# Patient Record
Sex: Male | Born: 1985
Health system: Southern US, Community
[De-identification: ages and names within clinical notes are randomized; demographics above are authoritative.]

## PROBLEM LIST (undated history)

## (undated) DIAGNOSIS — T783XXA Angioneurotic edema, initial encounter: Secondary | ICD-10-CM

## (undated) DIAGNOSIS — K219 Gastro-esophageal reflux disease without esophagitis: Secondary | ICD-10-CM

## (undated) DIAGNOSIS — D131 Benign neoplasm of stomach: Secondary | ICD-10-CM

## (undated) DIAGNOSIS — K2210 Ulcer of esophagus without bleeding: Secondary | ICD-10-CM

## (undated) HISTORY — DX: Gastro-esophageal reflux disease without esophagitis: K21.9

## (undated) HISTORY — DX: Ulcer of esophagus without bleeding: K22.10

## (undated) HISTORY — DX: Benign neoplasm of stomach: D13.1

## (undated) HISTORY — PX: TONSILLECTOMY: SUR1361

## (undated) HISTORY — PX: ESOPHAGOGASTRODUODENOSCOPY: SHX1529

## (undated) HISTORY — DX: Angioneurotic edema, initial encounter: T78.3XXA

---

## 2009-05-25 ENCOUNTER — Encounter: Payer: Self-pay | Admitting: Internal Medicine

## 2009-05-27 ENCOUNTER — Encounter (INDEPENDENT_AMBULATORY_CARE_PROVIDER_SITE_OTHER): Payer: Self-pay | Admitting: *Deleted

## 2009-05-27 ENCOUNTER — Encounter: Admission: RE | Admit: 2009-05-27 | Discharge: 2009-05-27 | Payer: Self-pay | Admitting: Internal Medicine

## 2009-05-27 ENCOUNTER — Encounter: Payer: Self-pay | Admitting: Internal Medicine

## 2009-06-14 ENCOUNTER — Encounter (INDEPENDENT_AMBULATORY_CARE_PROVIDER_SITE_OTHER): Payer: Self-pay | Admitting: *Deleted

## 2009-07-27 ENCOUNTER — Encounter (INDEPENDENT_AMBULATORY_CARE_PROVIDER_SITE_OTHER): Payer: Self-pay | Admitting: *Deleted

## 2009-07-27 ENCOUNTER — Ambulatory Visit: Payer: Self-pay | Admitting: Internal Medicine

## 2009-07-27 DIAGNOSIS — R142 Eructation: Secondary | ICD-10-CM

## 2009-07-27 DIAGNOSIS — R935 Abnormal findings on diagnostic imaging of other abdominal regions, including retroperitoneum: Secondary | ICD-10-CM

## 2009-07-27 DIAGNOSIS — R141 Gas pain: Secondary | ICD-10-CM

## 2009-07-27 DIAGNOSIS — K219 Gastro-esophageal reflux disease without esophagitis: Secondary | ICD-10-CM

## 2009-07-27 DIAGNOSIS — R1319 Other dysphagia: Secondary | ICD-10-CM

## 2009-07-27 DIAGNOSIS — R143 Flatulence: Secondary | ICD-10-CM

## 2009-07-28 ENCOUNTER — Ambulatory Visit: Payer: Self-pay | Admitting: Internal Medicine

## 2009-07-28 DIAGNOSIS — K2210 Ulcer of esophagus without bleeding: Secondary | ICD-10-CM

## 2009-07-28 HISTORY — DX: Ulcer of esophagus without bleeding: K22.10

## 2009-08-04 ENCOUNTER — Telehealth: Payer: Self-pay | Admitting: Internal Medicine

## 2009-08-17 ENCOUNTER — Ambulatory Visit (HOSPITAL_COMMUNITY): Admission: RE | Admit: 2009-08-17 | Discharge: 2009-08-17 | Payer: Self-pay | Admitting: Internal Medicine

## 2009-08-26 ENCOUNTER — Ambulatory Visit: Payer: Self-pay | Admitting: Internal Medicine

## 2009-08-29 ENCOUNTER — Telehealth (INDEPENDENT_AMBULATORY_CARE_PROVIDER_SITE_OTHER): Payer: Self-pay

## 2009-12-16 ENCOUNTER — Encounter: Payer: Self-pay | Admitting: Internal Medicine

## 2010-08-17 NOTE — Progress Notes (Signed)
Summary: [facility] Referral/Dr Alycia Rossetti  ---- Converted from flag ---- ---- 08/28/2009 11:24 AM, Iva Boop MD, Clementeen Graham wrote: Orlan Leavens appt with Dr. Alycia Rossetti re: Bloating problems and ? of abnormal upper GI function/motility - send all our records needs discs of all xrays to go also, in [facility] format ------------------------------  Phone Note Outgoing Call Call back at [facility] Phone [phone]   Call placed by: Darcey Nora RN, CGRN,  August 29, 2009 11:39 AM Call placed to: Patient Summary of Call: Patient  is scheduled to see Dr Alycia Rossetti at [facility] 12-14-09 2 pm.  I have left a messge for the patient to call back to discuss appointment details Initial call taken by: Darcey Nora RN, CGRN,  August 29, 2009 11:40 AM  Follow-up for Phone Call        I have left a message fo the patient with the appointment details and that he will need to get a disk of his GES to take with him Follow-up by: Darcey Nora RN, CGRN,  August 30, 2009 11:09 AM     Appended Document: [facility] Referral/Dr Alycia Rossetti the patient did not show for appointment please contact him about this  Appended Document: [facility] Referral/Dr [facility] Left message for patient to call back    Appended Document: [facility] Referral/Dr Alycia Rossetti I spoke with the patient's mother.  Patient  was in [place] and he missed his flight and didn't return till 12/15/09.  He is rescheduled to see Dr Alycia Rossetti 03/22/10 2:00

## 2010-08-17 NOTE — Assessment & Plan Note (Signed)
Summary: GERD   History of Present Illness Visit Type: Initial Consult Primary GI MD: Stan Head MD [facility] Primary Provider: Creola Corn, MD Chief Complaint: Pt. c/o heartburn, bloating and sometimes difficulty swallowing worse while lying down at night History of Present Illness:   25 year old white man I am asked to see about acid reflux problems. He has had heartburn, indigestion, and acid reflux for years, since childhood it sounds like. His father is here and participate in history. He saw Dr. Timothy Lasso about this recently, and was prescribed Protonix which has alleviated his symptoms significantly but not completely. He also describes bloating, and an inability to belch. He will often hear "oises"n in his stomach and chest, particularly after eating or drinking carbonated beverages. If he lies on his left side this is more of a problem or more noticeable. The bloating and gas pressure couple with the inability to burp to cause problems. He has some increased flatus but not significantly. I should say it's not that he cannot belch but it is rare he says. He also describes some dysphagia, with a suprasternal sticking point with solids and liquids and perhaps some mild globus issues. In the past few months he's had some alternating bowel habits, but that was not or ever chronic, and has resolved. The remainder of his GI review of systems is negative.  He did have an upper GI series, I have reviewed the report and the images, it shows multiple  reflux episodes, with a small sliding hiatal hernia and normal motility and what is described as a normal appearance of the stomach and duodenum. The reflux occurred up to the mid and upper esophageal level.   GI Review of Systems    Reports acid reflux, bloating, dysphagia with liquids, dysphagia with solids, and  heartburn.      Denies abdominal pain, belching, chest pain, loss of appetite, nausea, vomiting, vomiting blood, weight loss, and  weight gain.     Reports constipation and  diarrhea.     Denies anal fissure, black tarry stools, change in bowel habit, diverticulosis, fecal incontinence, heme positive stool, hemorrhoids, irritable bowel syndrome, jaundice, light color stool, liver problems, rectal bleeding, and  rectal pain.    Current Medications (verified): 1)  Protonix 40 Mg Tbec (Pantoprazole Sodium) .Marland Kitchen.. 1 Tablet By Mouth Once Daily  Allergies (verified): 1)  ! Amoxicillin 2)  ! Pcn  Past History:  Past Medical History: Unremarkable  Past Surgical History: Tonsillectomy  Family History: No FH of Colon Cancer: Lung Cancer-MGF Family History of Heart Disease: PGF  Review of Systems       The patient complains of allergy/sinus.         All other ROS negative except as per HPI.   Vital Signs:  Patient profile:   25 year old male Height:      73 inches Weight:      193.38 pounds BMI:     25.61 Pulse rate:   104 / minute Pulse rhythm:   regular BP sitting:   144 / 78  (left arm)  Vitals Entered By: Milford Cage NCMA (July 27, 2009 2:45 PM)  Physical Exam  General:  Well developed, well nourished, no acute distress. Eyes:  PERRLA, no icterus. Mouth:  No deformity or lesions, dentition normal. Neck:  Supple; no masses or thyromegaly. Lungs:  Clear throughout to auscultation. Heart:  Regular rate and rhythm; no murmurs, rubs,  or bruits. Abdomen:  tympanitic over upper abdomen, mildly distended there, no masses,  HSM, herniae. BS+,  Extremities:  No clubbing, cyanosis, edema or deformities noted. Neurologic:  Alert and  oriented x4;   Cervical Nodes:  No significant cervical or supraclavicular adenopathy.  Psych:  Alert and cooperative. Normal mood and affect.   Impression & Recommendations:  Problem # 1:  GASTROESOPHAGEAL REFLUX DISEASE (ICD-530.81) Proven by hx and UGI series. ? if related topossible gastric or intestinal dysfunction given impression of distended stomach or bowel  on exam today. I  think there is Orders: EGD (EGD) this will also serve to screen for Barrett's esophagus  Problem # 2:  DYSPHAGIA (ICD-787.29) No stricture on UGI, ? undetected stricture or motility issue, could be related to GERD/inflammation, ? eosinophilic esophagitis. Presence of what sounds like globus is noted. Risks, benefits,and indications of endoscopic procedure(s) were reviewed with the patient and all questions answered.   Orders: EGD (EGD) - possible dilation  Problem # 3:  ABDOMINAL BLOATING (ICD-787.3) He has "trapped air" and can't belch much at all. Coupled with abdominal exam and UGI scout xrays suggests some abnormality in gut (dilated).  Will possibly need diet change. Check AXR, EGD also fisrt.  Orders: T-Abdomen 2-view (74020TC)  Problem # 4:  GASTROINTESTINAL XRAY, ABNORMAL (ICD-793.4) dilated left colon (I think) UGI scout, ? if stomach ? anatomic issues  Orders: T-Abdomen 2-view (27062BJ)  may need other imaging also await EGD  Patient Instructions: 1)  Please go to the basement to have your xray done  today.  2)  We will see you at your procedure on 07/28/09. 3)  [facility] Patient Information Guide given to patient.  4)  Upper Endoscopy with Dilatation brochure given.  5)  The medication list was reviewed and reconciled.  All changed / newly prescribed medications were explained.  A complete medication list was provided to the patient / caregiver. cc: Dr. Creola Corn

## 2010-08-17 NOTE — Progress Notes (Signed)
Summary: Triage  Phone Note Call from Patient Call back at [phone]   Caller: father Ray Call For: Dr. Leone Payor Reason for Call: Talk to Nurse Summary of Call: Pt said he is returning you call about some test results Initial call taken by: Karna Christmas,  August 04, 2009 1:20 PM  Follow-up for Phone Call        Patient  is currently in [place] , his father will have him call me back when he returns next week. Follow-up by: Darcey Nora RN, CGRN,  August 04, 2009 1:57 PM

## 2010-08-17 NOTE — Assessment & Plan Note (Signed)
Summary: follow up ges/sherBloating, GERD, dysphagia   History of Present Illness Visit Type: Follow-up Visit Primary GI MD: Stan Head MD [facility] Primary Provider: Creola Corn, MD Requesting Provider: n/a Chief Complaint: F/u from GES  History of Present Illness:   25 year old single white man seen on July 27, 2009 regarding reflux and problems with inability to belch. He is currently on protonic 40 mg daily with no heartburn or reflux symptoms. He continues, however, to have an inability to belch and problems with gas and bloating. He had what was perceived to be dilated stomach  on x-ray at one point.  An upper GI series had been previously performed, showing a small sliding hiatus hernia but otherwise no significant abnormalities reported. It was my perception that the colon was somewhat dilated on the scout film. EGD 1/13 demonstrated mild erosive esophagitis, chronic actine gastrtis without H. pylori and small hiatus hernia. GAstric emtying study showed complete emptying of the stomach at 2 hours (<1% remaining).  He still has problems with his bloating and inability to belch after eating.Alka seltzer, CO2 no help and makes things worse. He again reports that ingesting beer is particularly troublesome.            Current Medications (verified): 1)  Protonix 40 Mg Tbec (Pantoprazole Sodium) .Marland Kitchen.. 1 Tablet By Mouth Once Daily  Allergies (verified): 1)  ! Amoxicillin 2)  ! Pcn  Past History:  Past Medical History: Reviewed history from 07/27/2009 and no changes required. Unremarkable  Past Surgical History: Reviewed history from 07/27/2009 and no changes required. Tonsillectomy  Family History: Reviewed history from 07/27/2009 and no changes required. No FH of Colon Cancer: Lung Cancer-MGF Family History of Heart Disease: PGF  Social History: Occupation:[employer]  Patient is a former smoker.  Alcohol Use - yes: Occ  Daily Caffeine Use: 4 times daily    Illicit Drug Use - no Smoking Status:  quit Drug Use:  no  Vital Signs:  Patient profile:   25 year old male Height:      73 inches Weight:      194 pounds BMI:     25.69 BSA:     2.13 Pulse rate:   98 / minute BP sitting:   124 / 76  (left arm) Cuff size:   regular  Vitals Entered By: Ok Anis CMA (August 26, 2009 2:29 PM)  Physical Exam  General:  Well developed, well nourished, no acute distress.   Impression & Recommendations:  Problem # 1:  ABDOMINAL BLOATING (ICD-787.3) Assessment Unchanged I am not certain as to the cause of this. I am suspicious of some type of gastrointestinal motility disturbance. It is not necessarily abnormal but it is unusual for the stomach to empty completely in 2 hours in my experience. I have explained the overall situation to him. His EGD shows gastritis question of that could be related, his reflux is controlled, there have been some x-rays demonstrated dilation of the stomach and I think perhaps the left colon or splenic flexure area at one point. Though this is not, at this point, a significant medical condition as far as I can tell it does affect his quality of life. Will refer to Dr. Beverly Gust for additinal evaluattion and tretment recommendations.  Problem # 2:  GASTROESOPHAGEAL REFLUX DISEASE (ICD-530.81) Assessment: Improved there was an erosion at the July 28, 2009 EGD. He is responding nicely to proton next 40 mg daily and we will continue that.  Problem # 3:  OTHER  DYSPHAGIA (ICD-787.29) Assessment: Improved resolved on PPI so presumably due to reflux esophagitis.  Patient Instructions: 1)  Please pick up your medications at your pharmacy--Protonix 2)  We will make a referral appointment for you with Dr. Alycia Rossetti (pronounced cook) at [facility].  You should hear from [place] in 1-2 weeks.  If you have not, please call our office. 3)  Copy sent to : Creola Corn, MD 4)  The medication list was reviewed and reconciled.  All changed /  newly prescribed medications were explained.  A complete medication list was provided to the patient / caregiver. Prescriptions: PROTONIX 40 MG TBEC (PANTOPRAZOLE SODIUM) 1 tablet by mouth once daily  #30 x 11   Entered by:   Francee Piccolo CMA (AAMA)   Authorized by:   Iva Boop MD, [facility]   Signed by:   Francee Piccolo CMA (AAMA) on 08/26/2009   Method used:   Electronically to        [facility] [id number] (retail)       [address] [place], [place]  [address]       Ph: [phone]       Fax: [phone]   RxID:   [id number]

## 2010-08-17 NOTE — Procedures (Signed)
Summary: Upper Endoscopy  Patient: Francis [name] Note: All result statuses are Final unless otherwise noted.  Tests: (1) Upper Endoscopy (EGD)   EGD Upper Endoscopy       DONE     [facility]     [address]     [place], [place]  [address]           ENDOSCOPY PROCEDURE REPORT           PATIENT:  [name], Francis  MR#:  [id number]     BIRTHDATE:  [birth year], 23 yrs. old  GENDER:  male           ENDOSCOPIST:  Iva Boop, MD, [facility]     Referred by:  Creola Corn, M.D.           PROCEDURE DATE:  07/28/2009     PROCEDURE:  EGD with biopsy     ASA CLASS:  Class I     INDICATIONS:  GERD, dysphagia vague dysphagia, mainly when he     turns head to side in bed, difficult to swallow saliva, liquid -     has never had dysphagia with meat           MEDICATIONS:   Fentanyl 100 mcg IV, Versed 10 mg     TOPICAL ANESTHETIC:  Exactacain Spray           DESCRIPTION OF PROCEDURE:   After the risks benefits and     alternatives of the procedure were thoroughly explained, informed     consent was obtained.  The LB GIF-H180 [id number] endoscope was     introduced through the mouth and advanced to the second portion of     the duodenum, without limitations.  The instrument was slowly     withdrawn as the mucosa was fully examined.     <<PROCEDUREIMAGES>>           An erosion was found in the distal esophagus. Just above z-line,     small erosion 2-3 mm size Multiple biopsies were obtained and sent     to pathology.  Erythema was found in the antrum. Associated     hypervascularity, ? gastritis. Multiple biopsies were obtained and     sent to pathology.  A hiatal hernia was found. It was 1 - 2 cm in     size and sliding.  Otherwise the examination was normal.     Retroflexed views revealed no abnormalities.    The scope was then     withdrawn from the patient and the procedure completed.     COMPLICATIONS:  None           ENDOSCOPIC IMPRESSION:     1) Erosion in the distal  esophagus - biopsied     2) Erythema and hypervascularity  in the antrum - biopsied ?     gastritis     3) 1 - 2 cm sliding hiatal hernia     4) Otherwise normal examination     RECOMMENDATIONS:     1) await biopsy results     continue Protonix 40 mg daily           his stomach appeared dilated on abdominal films yesterday, given     his problems with GERD and bloating will consider gastric emptying     study pending biopsy results           No stricture seen and further questioning today indicated that the  swallowing problems are only with head turned so I did not dilate     the esophagus.           Note: He tolerated the exam well but was not very sedated.           REPEAT EXAM:  as needed           Iva Boop, MD, Clementeen Graham           CC:  Creola Corn, MD, The Patient           n.     eSIGNED:   Iva Boop at 07/28/2009 04:17 PM           [name], Francis, [id number]  Note: An exclamation mark (!) indicates a result that was not dispersed into the flowsheet. Document Creation Date: 07/28/2009 4:17 PM _______________________________________________________________________  (1) Order result status: Final Collection or observation date-time: 07/28/2009 16:07 Requested date-time:  Receipt date-time:  Reported date-time:  Referring Physician:   Ordering Physician: Stan Head [phone]) Specimen Source:  Source: Launa Grill Order Number: [id number] Lab site:

## 2010-08-17 NOTE — Letter (Signed)
Summary: No show/[facility]  No show/[facility]   Imported By: Lester Menasha 01/03/2010 09:44:43  _____________________________________________________________________  External Attachment:    Type:   Image     Comment:   External Document

## 2010-08-17 NOTE — Letter (Signed)
Summary: Append:Upper GI/[facility]  Append:Upper GI/[facility]   Imported By: Sherian Rein 08/01/2009 09:46:13  _____________________________________________________________________  External Attachment:    Type:   Image     Comment:   External Document

## 2010-08-17 NOTE — Letter (Signed)
Summary: OV/[facility]  OV/[facility]   Imported By: Sherian Rein 08/01/2009 09:43:54  _____________________________________________________________________  External Attachment:    Type:   Image     Comment:   External Document

## 2010-08-17 NOTE — Letter (Signed)
Summary: EGD Instructions  [facility]  [address] [place], [place] [address]   Phone: [phone]  Fax: [phone]       Francis [name]    Nov 23, 1985    MRN: [id number]       Procedure Day /Date: Thursday, 07/28/09     Arrival Time: 2:00pm     Procedure Time: 3:00pm     Location of Procedure:                    _X_ [facility] (4th Floor)  PREPARATION FOR ENDOSCOPY   On Thursday, 07/28/09, THE DAY OF THE PROCEDURE:  1.   No solid foods, milk or milk products are allowed after midnight the night before your procedure.  2.   Do not drink anything colored red or purple.  Avoid juices with pulp.  No orange juice.  3.  You may drink clear liquids until 1:00pm, which is 2 hours before your procedure.                                                                                                CLEAR LIQUIDS INCLUDE: Water Jello Ice Popsicles Tea (sugar ok, no milk/cream) Powdered fruit flavored drinks Coffee (sugar ok, no milk/cream) Gatorade Juice: apple, white grape, white cranberry  Lemonade Clear bullion, consomm, broth Carbonated beverages (any kind) Strained chicken noodle soup Hard Candy   MEDICATION INSTRUCTIONS  Unless otherwise instructed, you should take regular prescription medications with a small sip of water as early as possible the morning of your procedure.                  OTHER INSTRUCTIONS  You will need a responsible adult at least 24 years of age to accompany you and drive you home.   This person must remain in the waiting room during your procedure.  Wear loose fitting clothing that is easily removed.  Leave jewelry and other valuables at home.  However, you may wish to bring a book to read or an iPod/MP3 player to listen to music as you wait for your procedure to start.  Remove all body piercing jewelry and leave at home.  Total time from sign-in until discharge is approximately 2-3 hours.  You  should go home directly after your procedure and rest.  You can resume normal activities the day after your procedure.  The day of your procedure you should not:   Drive   Make legal decisions   Operate machinery   Drink alcohol   Return to work  You will receive specific instructions about eating, activities and medications before you leave.    The above instructions have been reviewed and explained to me by   _______________________    I fully understand and can verbalize these instructions _____________________________ Date _________

## 2010-08-27 IMAGING — CR DG ABDOMEN 2V
3 series · 3 of 3 positions shown · non-contrast
Comparison: KUB prior to upper GI on 05/27/2009

CLINICAL DATA: Chronic bloating

ABDOMEN - 2 VIEW

[view not recorded (1 of 3)]
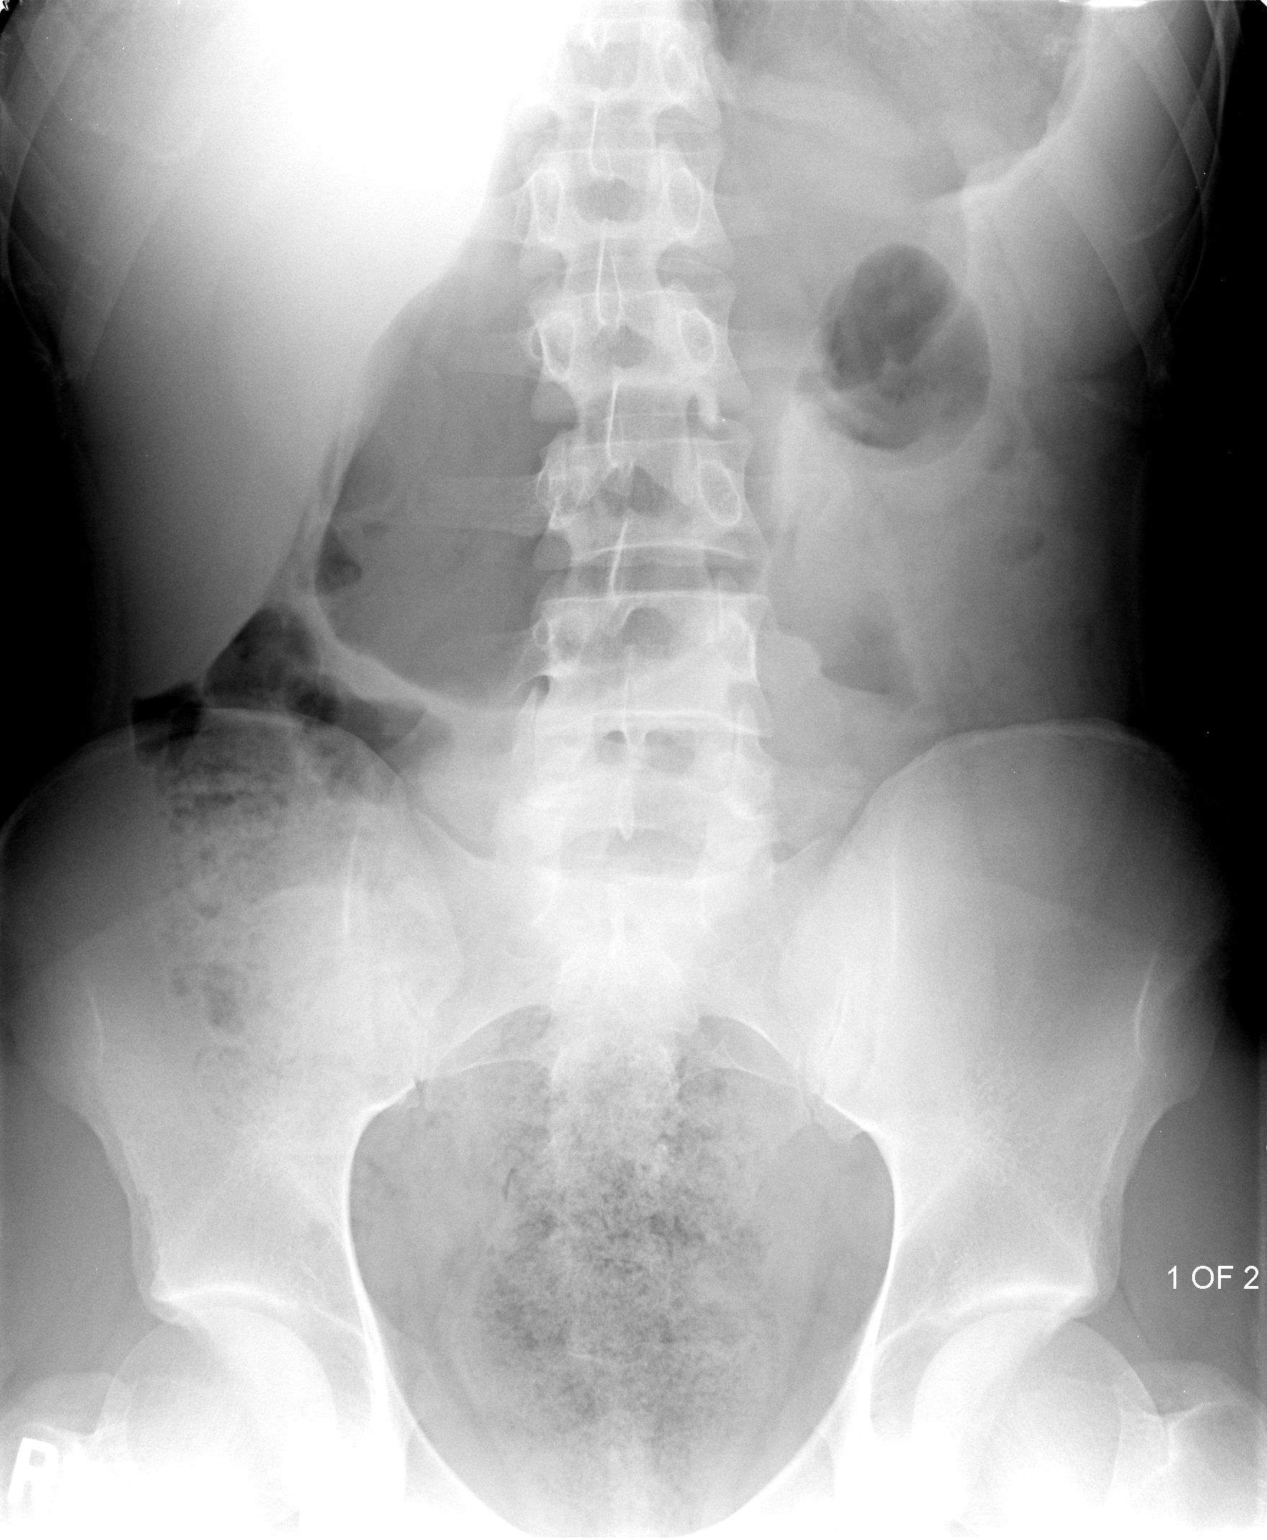

[view not recorded (2 of 3)]
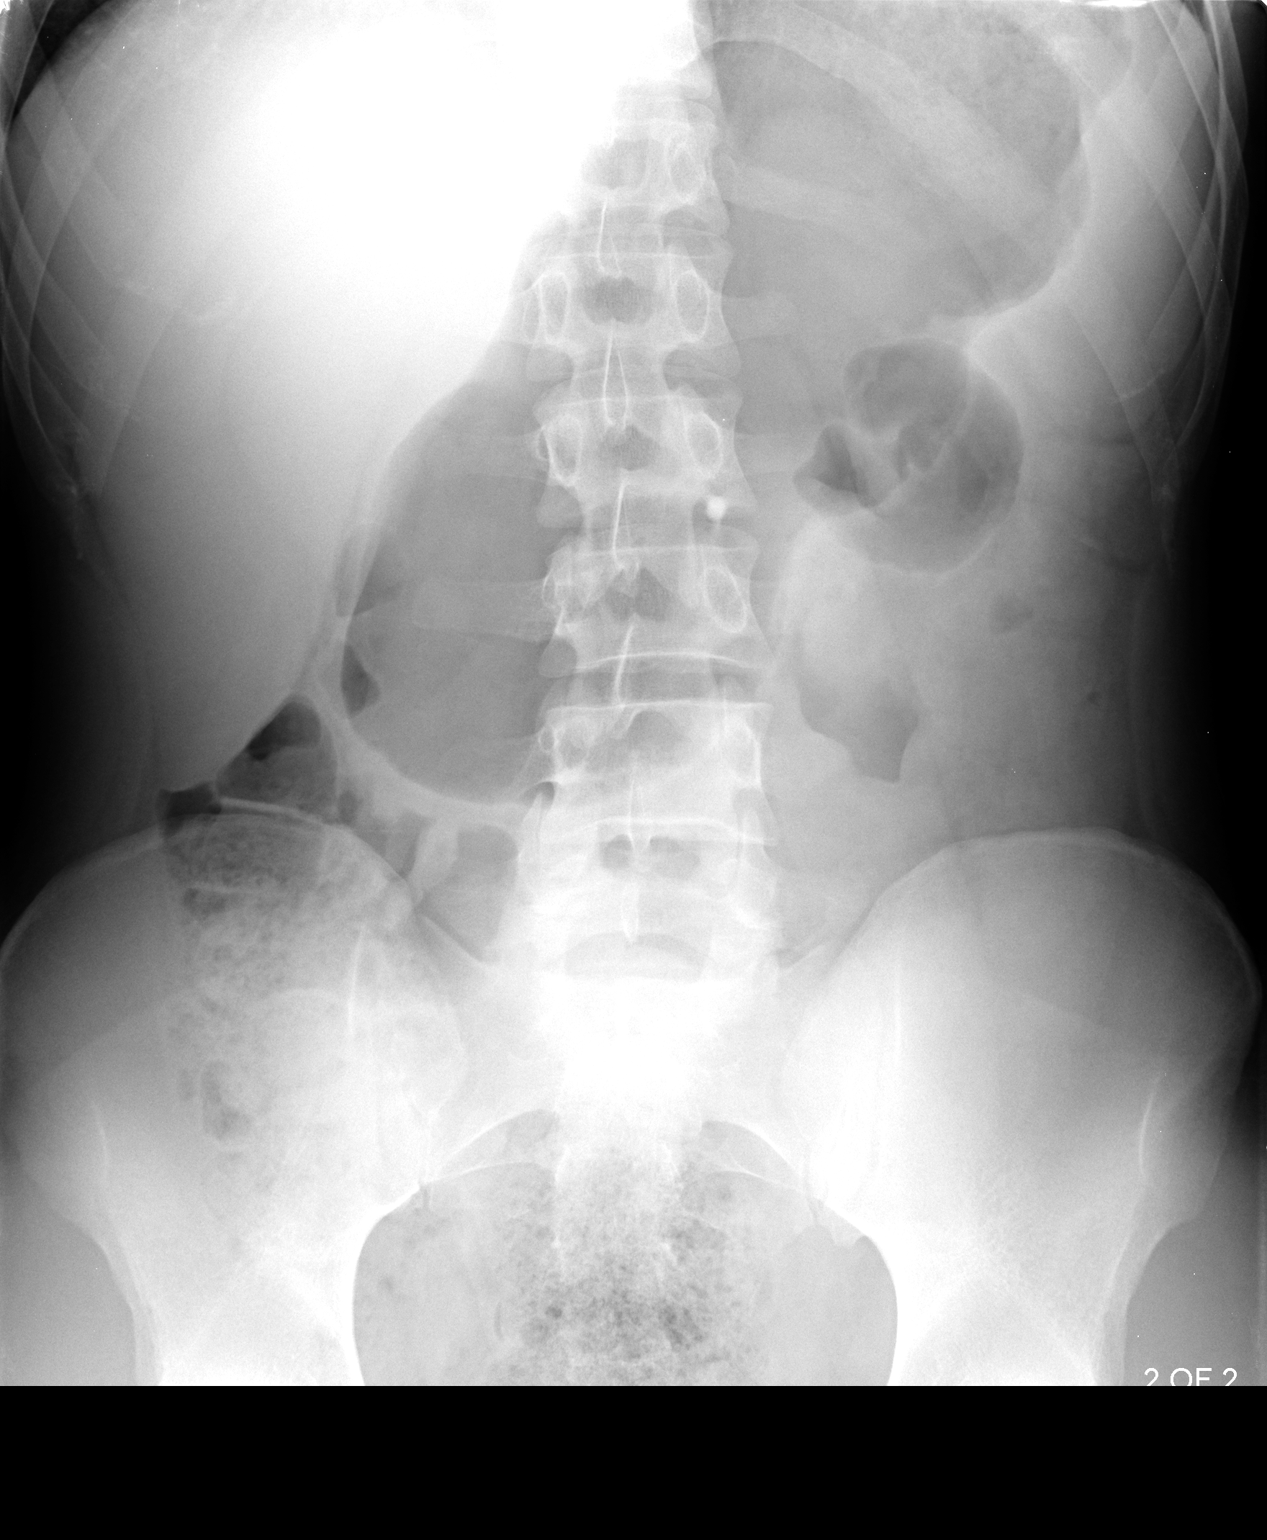

[view not recorded (3 of 3)]
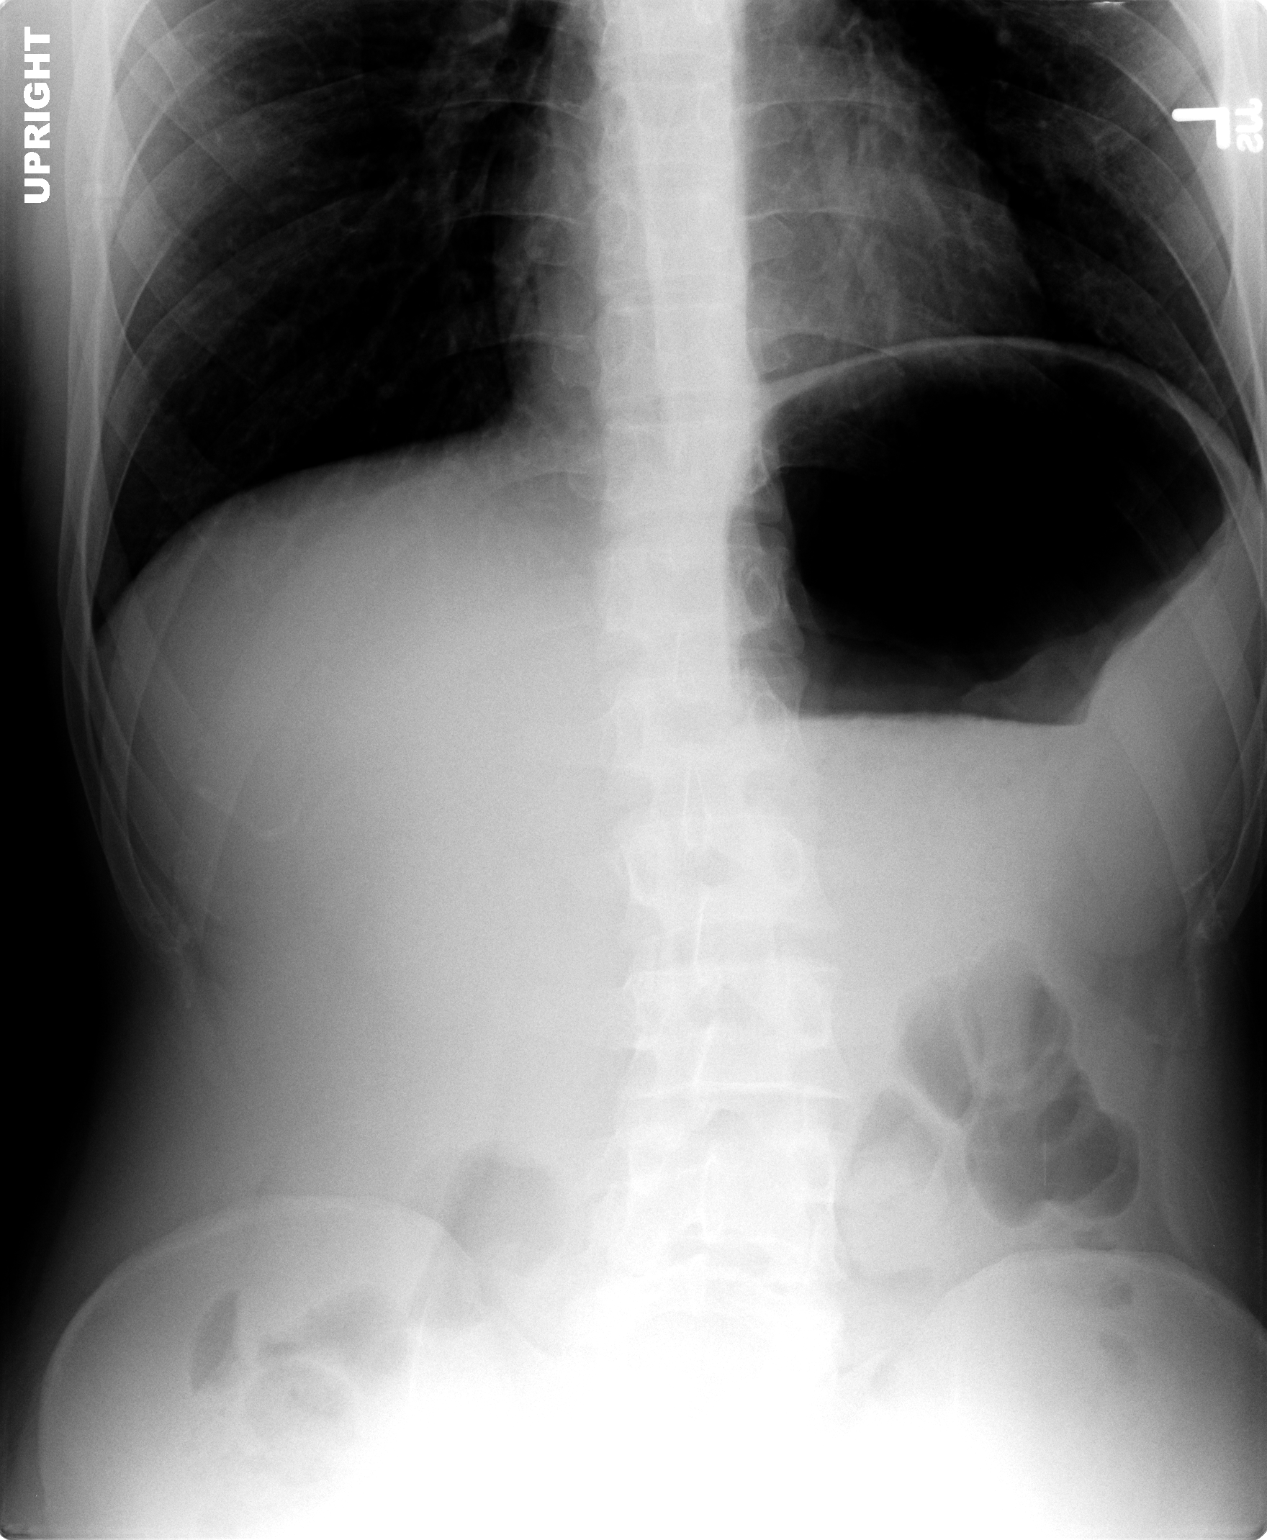

[3 of 3 positions shown; findings below may reference images not displayed]

FINDINGS: Fluid and gas does distend the stomach somewhat.  However
no bowel obstruction is seen.  There is a moderate amount of feces
in the rectosigmoid colon.  No free air is seen on the erect view.
IMPRESSION: Fluid and air distends the stomach.  No bowel obstruction is seen
and no free air is noted.

## 2012-03-18 ENCOUNTER — Other Ambulatory Visit: Payer: Self-pay

## 2012-04-03 ENCOUNTER — Other Ambulatory Visit: Payer: Self-pay

## 2013-01-28 ENCOUNTER — Other Ambulatory Visit: Payer: Self-pay

## 2013-09-17 ENCOUNTER — Other Ambulatory Visit: Payer: Self-pay

## 2014-09-13 ENCOUNTER — Encounter: Payer: Self-pay | Admitting: Internal Medicine

## 2014-10-18 ENCOUNTER — Ambulatory Visit (INDEPENDENT_AMBULATORY_CARE_PROVIDER_SITE_OTHER): Payer: Managed Care, Other (non HMO) | Admitting: Internal Medicine

## 2014-10-18 VITALS — BP 146/88 | HR 102 | Temp 97.7°F | Resp 16 | Ht 73.0 in | Wt 227.0 lb

## 2014-10-18 DIAGNOSIS — N50819 Testicular pain, unspecified: Secondary | ICD-10-CM

## 2014-10-18 DIAGNOSIS — N508 Other specified disorders of male genital organs: Secondary | ICD-10-CM

## 2014-10-18 LAB — POCT UA - MICROSCOPIC ONLY
Bacteria, U Microscopic: NEGATIVE
Casts, Ur, LPF, POC: NEGATIVE
Crystals, Ur, HPF, POC: NEGATIVE
Mucus, UA: NEGATIVE
YEAST UA: NEGATIVE

## 2014-10-18 LAB — POCT URINALYSIS DIPSTICK
Bilirubin, UA: NEGATIVE
Glucose, UA: NEGATIVE
Ketones, UA: NEGATIVE
Leukocytes, UA: NEGATIVE
NITRITE UA: NEGATIVE
PH UA: 7
Protein, UA: NEGATIVE
Spec Grav, UA: 1.02
Urobilinogen, UA: 0.2

## 2014-10-18 NOTE — Progress Notes (Signed)
   Subjective:  This chart was scribed for Tami Lin, MD by [facility], medical scribe at [facility].The patient was seen in exam room 05 and the patient's care was started at 4:13 PM.   Patient ID: Francis [name], male    DOB: 11-Jul-1986, 29 y.o.   MRN: [id number] Chief Complaint  Patient presents with  . Testicle Pain    1 week right   HPI HPI Comments: Francis [name] is a 29 y.o. male who presents to [facility] complaining of a recurrent constant, dull right testicular pain, onset 1.5 weeks ago. He has had a similar complaint 1.5 years ago. He says nothing worsens his pain. According to the pt his scrotum is relaxed in the morning then as the day it progresses the scrotum tightens up. He denies scrotal swelling, dysuria, penile discharge, and difficulty urinating. 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  Patient Active Problem List   Diagnosis Date Noted  . GASTROESOPHAGEAL REFLUX DISEASE 07/27/2009  . OTHER DYSPHAGIA 07/27/2009  . ABDOMINAL BLOATING 07/27/2009  . GASTROINTESTINAL XRAY, ABNORMAL 07/27/2009   Past Medical History  Diagnosis Date  . GERD (gastroesophageal reflux disease)   . Esophagitis, erosive 07/28/2009   Past Surgical History  Procedure Laterality Date  . Esophagogastroduodenoscopy    . Tonsillectomy     Allergies  Allergen Reactions  . Amoxicillin   . Penicillins    Prior to Admission medications   Medication Sig Start Date End Date Taking? Authorizing Provider  omeprazole-sodium bicarbonate (ZEGERID) 40-1100 MG per capsule Take 1 capsule by mouth daily before breakfast.   Yes Historical Provider, MD   Review of Systems  Genitourinary: Positive for testicular pain. Negative for dysuria, discharge, penile swelling, scrotal swelling, difficulty urinating and penile pain.      Objective:  Physical Exam  Constitutional: He is oriented to person, place, and time. He appears  well-developed and well-nourished. No distress.  HENT:  Head: Normocephalic and atraumatic.  Eyes: Pupils are equal, round, and reactive to light.  Neck: Normal range of motion.  Cardiovascular: Normal rate and regular rhythm.   Pulmonary/Chest: Effort normal. No respiratory distress.  Genitourinary:  No swelling in the inguinal areas, no lymphadenopathy, no inguinal hernia. The testicle has no swelling or masses on the right but there is tender to palpation of the testis and epididymis.  Musculoskeletal: Normal range of motion.  Neurological: He is alert and oriented to person, place, and time.  Skin: Skin is warm and dry.  Psychiatric: He has a normal mood and affect. His behavior is normal.  Nursing note and vitals reviewed. BP 146/88 mmHg  Pulse 102  Temp(Src) 97.7 F (36.5 C) (Oral)  Resp 16  Ht 6\' 1"  (1.854 m)  Wt 227 lb (102.967 kg)  BMI 29.96 kg/m2  SpO2 98%      Assessment & Plan:  Testicular pain - Plan: POCT urinalysis dipstick, POCT UA - Microscopic Only, GC/chlamydia probe amp, urine, US Scrotum  sch [place] R testis uriprobe  I have completed the patient encounter in its entirety as documented by the scribe, with editing by me where necessary. Shakiyah Cirilo P. Laney Pastor, M.D.

## 2014-10-20 ENCOUNTER — Other Ambulatory Visit: Payer: Self-pay | Admitting: Internal Medicine

## 2014-10-20 DIAGNOSIS — N50819 Testicular pain, unspecified: Secondary | ICD-10-CM

## 2014-10-20 LAB — GC/CHLAMYDIA PROBE AMP, URINE
Chlamydia, Swab/Urine, PCR: NEGATIVE
GC Probe Amp, Urine: NEGATIVE

## 2014-10-21 ENCOUNTER — Telehealth: Payer: Self-pay | Admitting: Internal Medicine

## 2014-10-21 NOTE — Telephone Encounter (Signed)
Patient returned call about his lab results. Could not find clinical staff to help patient so I read him the following message:   Notes Recorded by Yvette Rack on 10/21/2014 at 12:43 PM Left message on pt's machine to call back Notes Recorded by Leandrew Koyanagi, MD on 10/21/2014 at 12:22 PM No infection---awaiting testic ultrasnd  Patient understood. He will wait for a call about the ultrasound.  [phone]

## 2014-10-26 ENCOUNTER — Ambulatory Visit
Admission: RE | Admit: 2014-10-26 | Discharge: 2014-10-26 | Disposition: A | Discharge status: Home / Self Care | Payer: Managed Care, Other (non HMO) | Source: Ambulatory Visit | Attending: Internal Medicine | Admitting: Internal Medicine

## 2014-10-26 DIAGNOSIS — N50819 Testicular pain, unspecified: Secondary | ICD-10-CM

## 2014-10-28 ENCOUNTER — Telehealth: Payer: Self-pay | Admitting: Radiology

## 2014-10-28 NOTE — Telephone Encounter (Signed)
Pt calling about ultrasound results

## 2014-10-28 NOTE — Telephone Encounter (Signed)
IMPRESSION: 1. No evidence of testicular torsion or epididymitis/orchitis. 2. Borderline left-sided varicocele.(OK)  No cause of pain found---Normal Testicles Possibly this is inflammatory after lifting and straining Possibly early hernia   i suggest 10days of ibuprofen 800 tid and if not totally resolved we get urology consult

## 2014-10-28 NOTE — Telephone Encounter (Signed)
lmom to cb. 

## 2014-10-29 NOTE — Telephone Encounter (Signed)
Pt notified of results

## 2014-10-29 NOTE — Telephone Encounter (Signed)
Pt left message in lab concerning results.

## 2014-11-03 ENCOUNTER — Ambulatory Visit (INDEPENDENT_AMBULATORY_CARE_PROVIDER_SITE_OTHER): Payer: Managed Care, Other (non HMO) | Admitting: Internal Medicine

## 2014-11-03 ENCOUNTER — Encounter: Payer: Self-pay | Admitting: Internal Medicine

## 2014-11-03 VITALS — BP 136/80 | HR 78 | Ht 73.0 in | Wt 224.0 lb

## 2014-11-03 DIAGNOSIS — K2100 Gastro-esophageal reflux disease with esophagitis, without bleeding: Secondary | ICD-10-CM

## 2014-11-03 MED ORDER — DEXLANSOPRAZOLE 60 MG PO CPDR: 60.0000 mg | DELAYED_RELEASE_CAPSULE | Freq: Every day | ORAL | Status: DC

## 2014-11-03 NOTE — Patient Instructions (Signed)
Discontinue Zegerid  We have sent the following medications to your pharmacy for you to pick up at your convenience: Dexilant  We have given you information on GERD to look over and follow. We have also given you information regarding Nissen Fundoplication.  You may use Pepcid complete or gaviscon over the counter before bed if you have reflux or to prevent it.  Please send a MyChart message in 1 month to update [place] on how you are doing.  Gastroesophageal Reflux Disease, Adult Gastroesophageal reflux disease (GERD) happens when acid from your stomach flows up into the esophagus. When acid comes in contact with the esophagus, the acid causes soreness (inflammation) in the esophagus. Over time, GERD may create small holes (ulcers) in the lining of the esophagus. CAUSES   Increased body weight. This puts pressure on the stomach, making acid rise from the stomach into the esophagus.  Smoking. This increases acid production in the stomach.  Drinking alcohol. This causes decreased pressure in the lower esophageal sphincter (valve or ring of muscle between the esophagus and stomach), allowing acid from the stomach into the esophagus.  Late evening meals and a full stomach. This increases pressure and acid production in the stomach.  A malformed lower esophageal sphincter. Sometimes, no cause is found. SYMPTOMS   Burning pain in the lower part of the mid-chest behind the breastbone and in the mid-stomach area. This may occur twice a week or more often.  Trouble swallowing.  Sore throat.  Dry cough.  Asthma-like symptoms including chest tightness, shortness of breath, or wheezing. DIAGNOSIS  Your caregiver may be able to diagnose GERD based on your symptoms. In some cases, X-rays and other tests may be done to check for complications or to check the condition of your stomach and esophagus. TREATMENT  Your caregiver may recommend over-the-counter or prescription medicines to help decrease  acid production. Ask your caregiver before starting or adding any new medicines.  HOME CARE INSTRUCTIONS   Change the factors that you can control. Ask your caregiver for guidance concerning weight loss, quitting smoking, and alcohol consumption.  Avoid foods and drinks that make your symptoms worse, such as:  Caffeine or alcoholic drinks.  Chocolate.  Peppermint or mint flavorings.  Garlic and onions.  Spicy foods.  Citrus fruits, such as oranges, lemons, or limes.  Tomato-based foods such as sauce, chili, salsa, and pizza.  Fried and fatty foods.  Avoid lying down for the 3 hours prior to your bedtime or prior to taking a nap.  Eat small, frequent meals instead of large meals.  Wear loose-fitting clothing. Do not wear anything tight around your waist that causes pressure on your stomach.  Raise the head of your bed 6 to 8 inches with wood blocks to help you sleep. Extra pillows will not help.  Only take over-the-counter or prescription medicines for pain, discomfort, or fever as directed by your caregiver.  Do not take aspirin, ibuprofen, or other nonsteroidal anti-inflammatory drugs (NSAIDs). SEEK IMMEDIATE MEDICAL CARE IF:   You have pain in your arms, neck, jaw, teeth, or back.  Your pain increases or changes in intensity or duration.  You develop nausea, vomiting, or sweating (diaphoresis).  You develop shortness of breath, or you faint.  Your vomit is green, yellow, black, or looks like coffee grounds or blood.  Your stool is red, bloody, or black. These symptoms could be signs of other problems, such as heart disease, gastric bleeding, or esophageal bleeding. MAKE SURE YOU:   Understand  these instructions.  Will watch your condition.  Will get help right away if you are not doing well or get worse. Document Released: 04/11/2005 Document Revised: 09/24/2011 Document Reviewed: 01/19/2011 [facility] Patient Information 2015 [place], [place]. This information  is not intended to replace advice given to you by your health care provider. Make sure you discuss any questions you have with your health care provider.

## 2014-11-03 NOTE — Assessment & Plan Note (Signed)
Dexilant milligrams daily instead of the Zegerid Gaviscon/Pepcid complete prn GERD diet Fundoplication handout FYI Message me in 1 month with an update If not improving ? EGD

## 2014-11-03 NOTE — Progress Notes (Signed)
    Subjective:    Patient ID: Francis [name], male    DOB: 1985/07/22, 29 y.o.   MRN: [id number] Cc: reflux HPI The patient is a very pleasant white man here with a history of esophageal reflux problems. I know him from previous evaluation, he underwent an upper GI endoscopy in 2011 which showed a small sliding hiatal hernia, and erosion in the distal esophagus and endoscopic gastritis. He has suffered with an inability to belch, and bloating at times. I thought he might have had a dilated stomach on x-ray at one point a gastric emptying study, 2 hour study has been done and is been negative in 2011. Upper GI with high density contrast and KUB has demonstrated a sliding hiatal hernia and reflux in 2010.  After that evaluation he was overseas in [place] working, he had used pantoprazole or Protonix with good relief. He stopped taking that, he seemed to manage okay but then started to have symptoms again. He has subsequently used Nexium at one point, and now he is on Zegerid and he says if he does not take that twice a day he will have heartburn. If he eats pizza or other foods late at night he will have nocturnal reflux. He has not elevated the head of the bed. There is only one caffeinated beverage daily. He does not use tobacco. No significant excessive alcohol. Weight has been relatively stable. No dysphagia. No bleeding. Still has some bloating GI review of systems is otherwise negative.   Review of Systems As per history of present illness and positive for some occasional headaches. No chest pain no dyspnea.    Objective:   Physical Exam @BP  136/80 mmHg  Pulse 78  Ht 6\' 1"  (1.854 m)  Wt 224 lb (101.606 kg)  BMI 29.56 kg/m2@  General:  Well-developed, well-nourished and in no acute distress Eyes:  anicteric. ENT:   Mouth and posterior pharynx free of lesions.  Neck:   supple w/o thyromegaly or mass.  Lungs: Clear to auscultation bilaterally. Heart:  S1S2, no rubs, murmurs,  gallops. Abdomen:  soft, non-tender, no hepatosplenomegaly, hernia, or mass and BS+.  Rectal: Lymph:  no cervical or supraclavicular adenopathy. Extremities:   no edema, cyanosis or clubbing Skin   no rash. Neuro:  A&O x 3.  Psych:  appropriate mood and  Affect.   Data Reviewed: As per history of present illness       Assessment & Plan:   GASTROESOPHAGEAL REFLUX DISEASE Dexilant milligrams daily instead of the Zegerid Gaviscon/Pepcid complete prn GERD diet Fundoplication handout FYI Message me in 1 month with an update If not improving ? EGD

## 2014-12-06 ENCOUNTER — Encounter: Payer: Self-pay | Admitting: Internal Medicine

## 2015-05-26 ENCOUNTER — Encounter: Payer: Self-pay | Admitting: Internal Medicine

## 2015-09-21 ENCOUNTER — Encounter: Payer: Self-pay | Admitting: Internal Medicine

## 2015-10-03 ENCOUNTER — Encounter: Payer: Self-pay | Admitting: Internal Medicine

## 2015-10-05 ENCOUNTER — Encounter: Payer: Self-pay | Admitting: Internal Medicine

## 2015-10-05 DIAGNOSIS — K2100 Gastro-esophageal reflux disease with esophagitis, without bleeding: Secondary | ICD-10-CM

## 2015-10-05 MED ORDER — DEXLANSOPRAZOLE 60 MG PO CPDR: 60.0000 mg | DELAYED_RELEASE_CAPSULE | Freq: Every day | ORAL | Status: DC

## 2015-11-26 IMAGING — US US ART/VEN ABD/PELV/SCROTUM DOPPLER LTD
1 series · 14 of 25 positions shown · non-contrast
Comparison: None.

CLINICAL DATA: Right-sided pain.

EXAM:
SCROTAL ULTRASOUND
DOPPLER ULTRASOUND OF THE TESTICLES
TECHNIQUE: Complete ultrasound examination of the testicles, epididymis, and
other scrotal structures was performed. Color and spectral Doppler
ultrasound were also utilized to evaluate blood flow to the
testicles.

[Series 1: us art/ven abd/pelv/scrotum doppler ltd · 0.06mm/px · 14 of 73 slices shown]
[im 1/73]
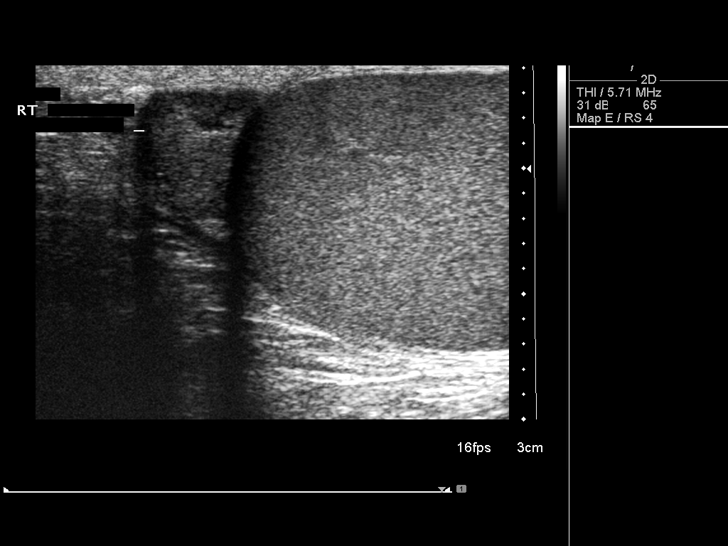
[im 7/73]
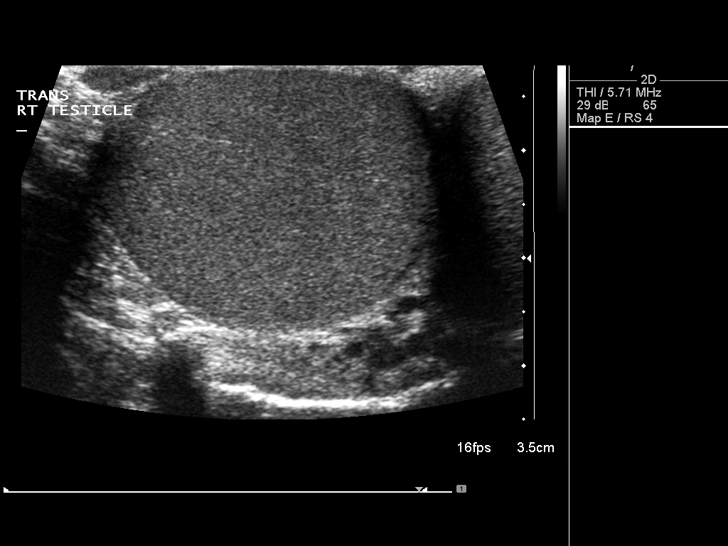
[im 13/73]
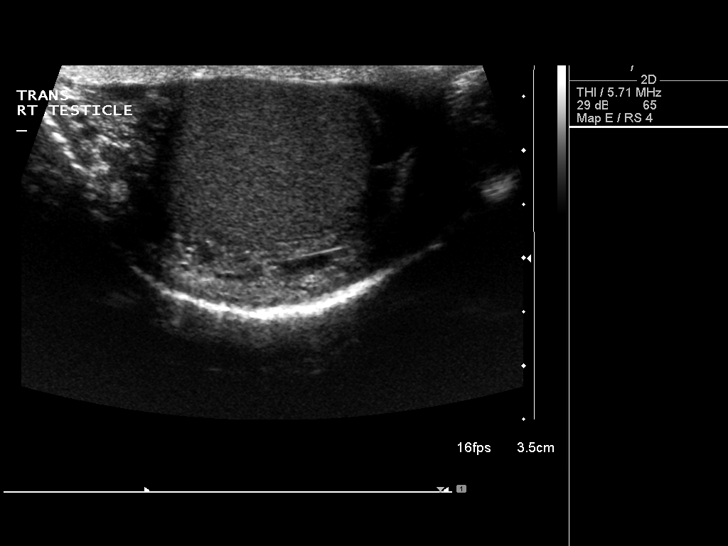
[im 19/73]
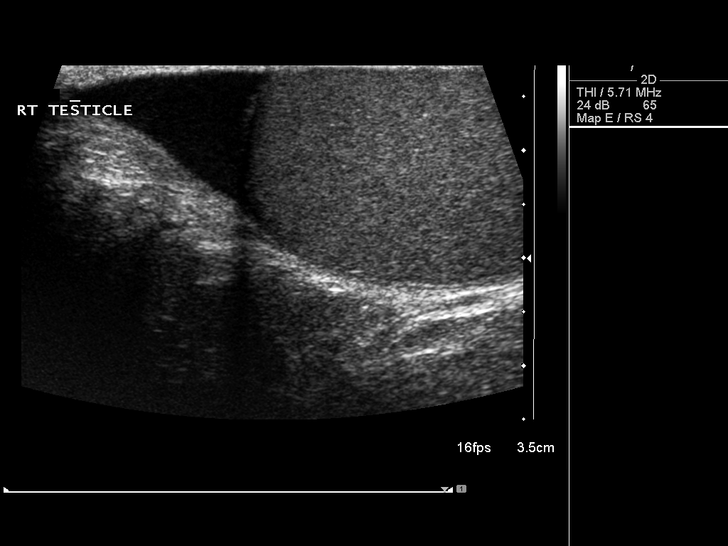
[im 25/73]
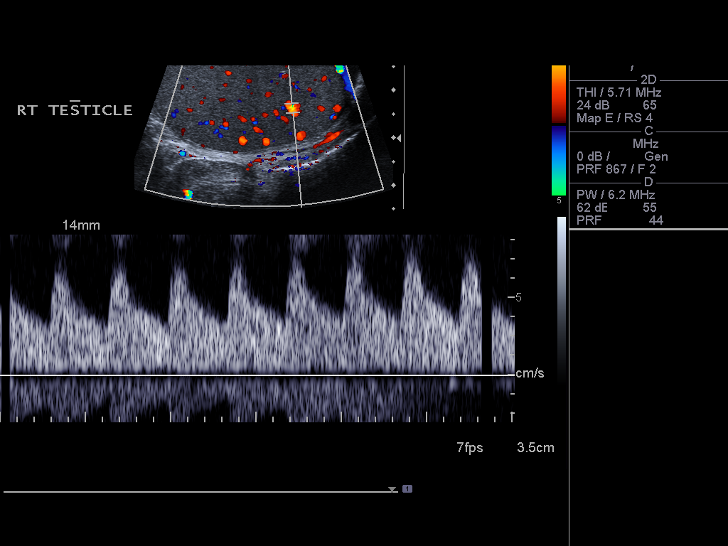
[im 28/73]
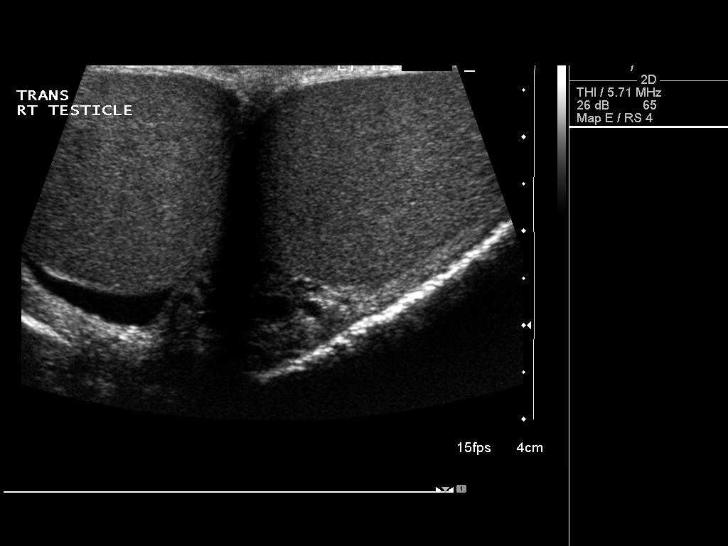
[im 34/73]
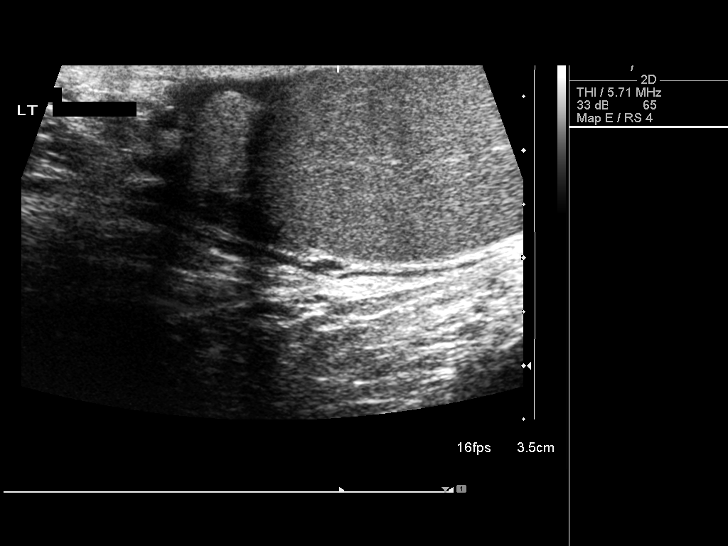
[im 40/73]
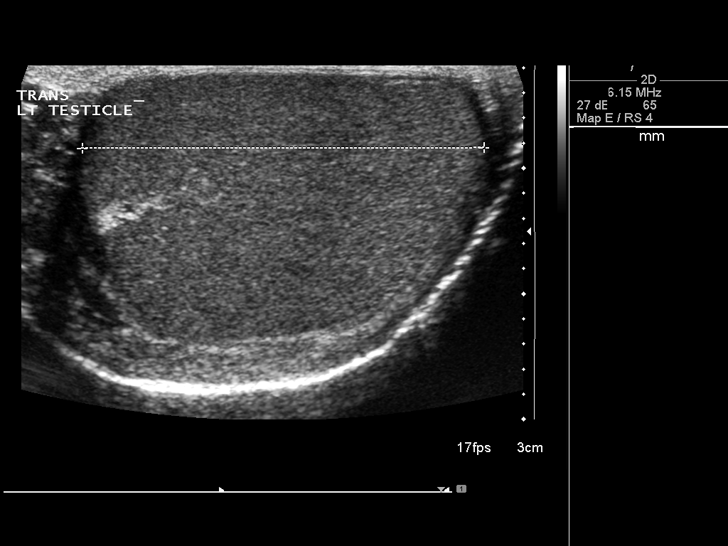
[im 46/73]
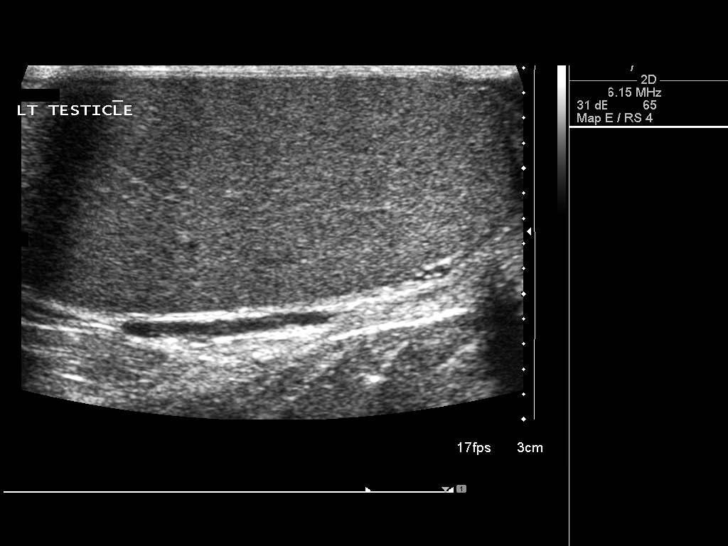
[im 49/73]
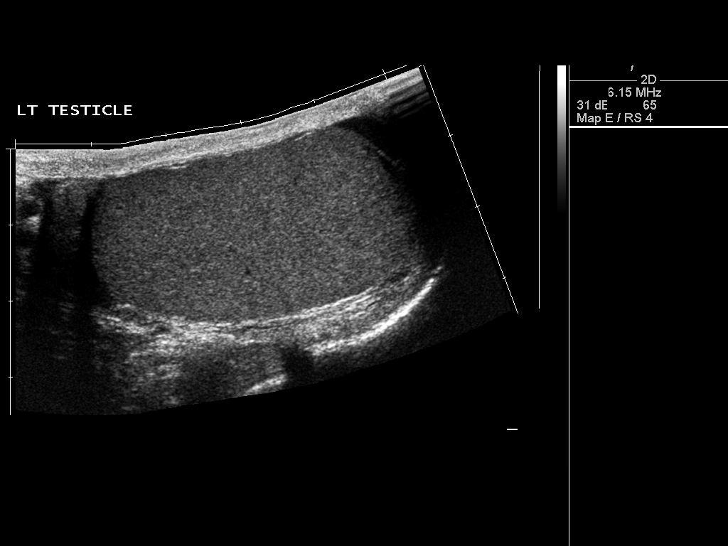
[im 55/73]
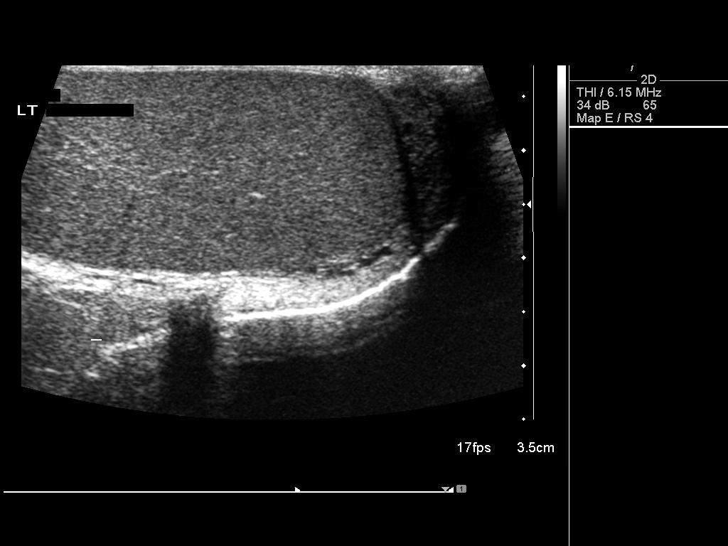
[im 61/73]
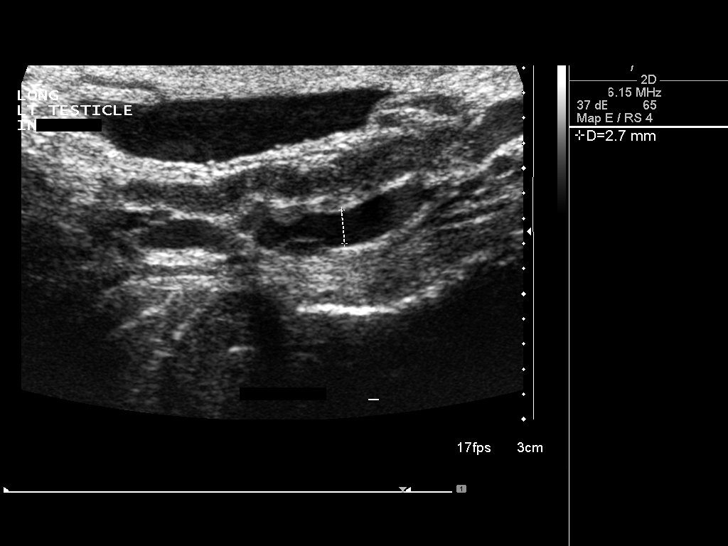
[im 67/73]
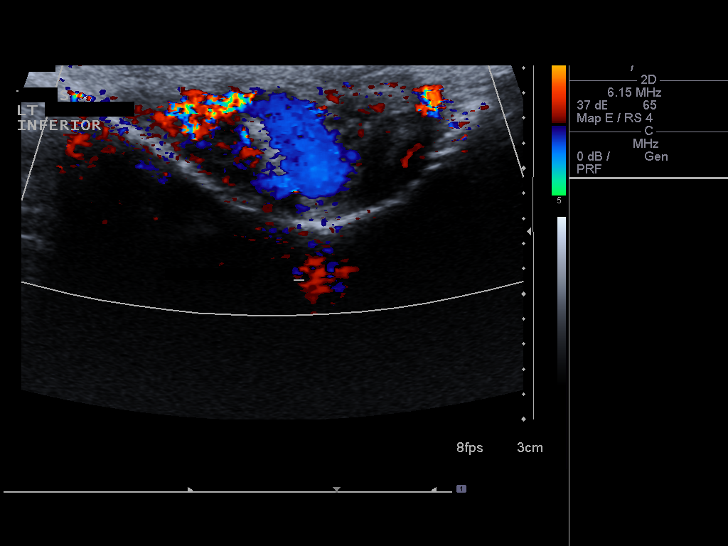
[im 73/73]
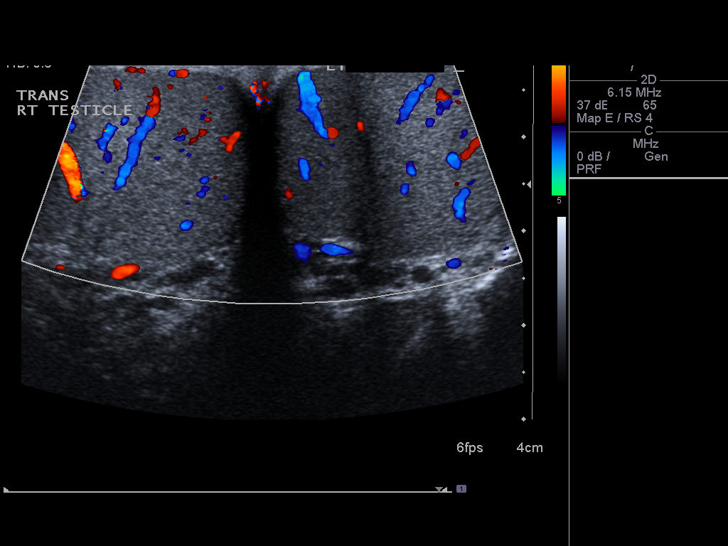

[14 of 25 positions shown; findings below may reference images not displayed]

FINDINGS: Right testicle

Measurements: 4.5 x 2.1 x 3.1 cm. No mass or microlithiasis
visualized.

Left testicle

Measurements: 4.1 x 1.9 x 3.2 cm. No mass or microlithiasis
visualized.

Right epididymis:  Normal in size and appearance.

Left epididymis:  Normal in size and appearance.

Hydrocele:  Trace bilateral scrotal fluid is likely physiologic.

Varicocele:  Borderline left-sided inferior/ medial varicocele.

Pulsed Doppler interrogation of both testes demonstrates normal low
resistance arterial and venous waveforms bilaterally. Normal color
Doppler signal bilaterally.
IMPRESSION: 1. No evidence of testicular torsion or epididymitis/orchitis.
2. Borderline left-sided varicocele.

## 2016-08-13 ENCOUNTER — Encounter: Payer: Self-pay | Admitting: Family Medicine

## 2016-08-13 ENCOUNTER — Ambulatory Visit (INDEPENDENT_AMBULATORY_CARE_PROVIDER_SITE_OTHER): Payer: Commercial Managed Care - PPO | Admitting: Family Medicine

## 2016-08-13 VITALS — BP 138/72 | HR 87 | Temp 98.5°F | Ht 72.75 in | Wt 225.8 lb

## 2016-08-13 DIAGNOSIS — Z0001 Encounter for general adult medical examination with abnormal findings: Secondary | ICD-10-CM

## 2016-08-13 DIAGNOSIS — K2100 Gastro-esophageal reflux disease with esophagitis, without bleeding: Secondary | ICD-10-CM

## 2016-08-13 DIAGNOSIS — R030 Elevated blood-pressure reading, without diagnosis of hypertension: Secondary | ICD-10-CM | POA: Diagnosis not present

## 2016-08-13 DIAGNOSIS — N50811 Right testicular pain: Secondary | ICD-10-CM

## 2016-08-13 DIAGNOSIS — Z7251 High risk heterosexual behavior: Secondary | ICD-10-CM

## 2016-08-13 DIAGNOSIS — S6991XA Unspecified injury of right wrist, hand and finger(s), initial encounter: Secondary | ICD-10-CM | POA: Insufficient documentation

## 2016-08-13 NOTE — Assessment & Plan Note (Addendum)
BP Readings from Last 3 Encounters:  08/13/16 138/72  11/03/14 136/80  10/18/14 (!) 146/88  would love to have this <130/80 so want to be really proactive on food choices and exercising  Target weight 200. Has been eating more fruits/veggies/whole grains lately- doing whole 30- lost 17 lbs last year with it. Then tried again but only lasted 10 days.

## 2016-08-13 NOTE — Assessment & Plan Note (Signed)
S: went to see urology within a year. At least 2 years of right testicular tightness/pain. Had ultrasound and was told normal except for some varicose veins on the right side. Worse after straining/stooling.    A/P:? Hernia per patient but do not feel any on exam. Will update std testing but biggest thing is to return to urology- strongly encouraged

## 2016-08-13 NOTE — Assessment & Plan Note (Signed)
Follows with Dr. Carlean Purl. Has done really well on dexilant. States only 1 episode of reflux since then (after a night of drinking). Plans to see him in next few months.

## 2016-08-13 NOTE — Patient Instructions (Addendum)
Bring by immunization copy  Work on regular 150 minutes a week exercise. Target weight closer to 200.   I would return to urology at this point- have them send me a copy of the visit notes. I do not feel an obvious hernia.   BP Readings from Last 3 Encounters:  08/13/16 138/72  11/03/14 136/80  10/18/14 (!) 146/88  would love to have this <130/80 so want to be really proactive on food choices and exercising  Schedule a lab visit at the check out desk within 2 weeks. Return for future fasting labs meaning nothing but water after midnight please. Ok to take your medications with water.   Update STD testing but doubt as cause of right testicular pain

## 2016-08-13 NOTE — Progress Notes (Signed)
Phone: [phone]  Subjective:  Patient presents today to establish care. Last seen by Dr. Virgina Jock locally- last seen 2010. Chief complaint-noted.   See problem oriented charting  The following were reviewed and entered/updated in epic: Past Medical History:  Diagnosis Date  . Esophagitis, erosive 07/28/2009  . GERD (gastroesophageal reflux disease)    Patient Active Problem List   Diagnosis Date Noted  . Elevated blood pressure reading 08/13/2016    Priority: Medium  . Right testicular pain 08/13/2016    Priority: Medium  . GASTROESOPHAGEAL REFLUX DISEASE 07/27/2009    Priority: Medium  . Injury of right index finger 08/13/2016    Priority: Low  . ABDOMINAL BLOATING 07/27/2009    Priority: Low  . Abnormal abdominal x-ray 07/27/2009    Priority: Low   Past Surgical History:  Procedure Laterality Date  . ESOPHAGOGASTRODUODENOSCOPY    . TONSILLECTOMY     young age    Family History  Problem Relation Age of Onset  . Healthy Mother   . Hypertension Father   . Rheum arthritis Father   . Lung cancer Maternal Grandfather   . Heart disease Paternal Grandfather   . CVA Maternal Grandmother     Medications- reviewed and updated Current Outpatient Prescriptions  Medication Sig Dispense Refill  . dexlansoprazole (DEXILANT) 60 MG capsule Take 1 capsule (60 mg total) by mouth daily. 90 capsule 3   No current facility-administered medications for this visit.     Allergies-reviewed and updated Allergies  Allergen Reactions  . Amoxicillin   . Penicillins     Social History   Social History  . Marital status: Single    Spouse name: N/A  . Number of children: N/A  . Years of education: N/A   Social History Main Topics  . Smoking status: Former Smoker    Packs/day: 0.10    Years: 4.00    Types: Cigarettes    Quit date: 07/16/2010  . Smokeless tobacco: Never Used  . Alcohol use 3.6 - 4.8 oz/week    6 - 8 Standard drinks or equivalent per week     Comment: occ.    . Drug use: No  . Sexual activity: Not Asked   Other Topics Concern  . None   Social History Narrative   Single no children. Lives with fiancee- planned June 2018.       Works Best boy at [employer].   2 year degree at [employer]: traveling/photography, outdoor activity.     ROS--Full ROS was completed Review of Systems  Constitutional: Negative for chills and fever.  HENT: Negative for hearing loss and tinnitus.   Eyes: Negative for blurred vision and double vision.  Respiratory: Negative for cough and hemoptysis.   Cardiovascular: Negative for chest pain and palpitations.  Gastrointestinal: Negative for abdominal pain, heartburn (as long as on dexilant) and nausea.  Genitourinary: Negative for dysuria and urgency.       Right testicular pain- feels like it draws up  Musculoskeletal: Negative for myalgias and neck pain.  Skin: Negative for itching and rash.  Neurological: Negative for dizziness and headaches.  Endo/Heme/Allergies: Negative for polydipsia. Does not bruise/bleed easily.  Psychiatric/Behavioral: Negative for hallucinations and substance abuse.   Objective: BP 138/72 (BP Location: Left Arm, Patient Position: Sitting, Cuff Size: Large)   Pulse 87   Temp 98.5 F (36.9 C) (Oral)   Ht 6' 0.75" (1.848 m)   Wt 225 lb 12.8 oz (102.4 kg)   SpO2  97%   BMI 30.00 kg/m  Gen: NAD, resting comfortably HEENT: Mucous membranes are moist. Oropharynx normal. TM normal. Eyes: sclera and lids normal, PERRLA Neck: no thyromegaly, no cervical lymphadenopathy CV: RRR no murmurs rubs or gallops Lungs: CTAB no crackles, wheeze, rhonchi Abdomen: soft/nontender/nondistended/normal bowel sounds. No rebound or guarding.  Ext: no edema Skin: warm, dry Neuro: 5/5 strength in upper and lower extremities, normal gait, normal reflexes GU: testicles normal bilaterally. No obvious hernia. ? Varicocele on right  Assessment/Plan:  31 y.o. male presenting for annual  physical.  Health Maintenance counseling: 1. Anticipatory guidance: Patient counseled regarding regular dental exams -regular check ups, eye exams -wears contacts, wearing seatbelts.  2. Risk factor reduction:  Advised patient of need for regular exercise and diet rich and fruits and vegetables to reduce risk of heart attack and stroke. Exercise- advised 150 minutes a week, not doing well on this front. Diet-reasonble- some improvement after whole 30 attempt recently.  Wt Readings from Last 3 Encounters:  08/13/16 225 lb 12.8 oz (102.4 kg)  11/03/14 224 lb (101.6 kg)  10/18/14 227 lb (103 kg)   3. Immunizations/screenings/ancillary studies- get records as had immunizations around 2014 before going to [place] including tdap  Health Maintenance Due  Topic Date Due  . HIV Screening - update all STD testing today except HBV 02/16/2002   4. Prostate cancer screening- no family history, start at age 15   5. Colon cancer screening - no family history, start at age 75 6. Skin cancer screening/prevention- advised regular sunscreen. Goes to [facility] dermatology Dr. Fontaine No yearly.  7. Testicular cancer screening- advised monthly self exams. Also had ultrasound as noted.  8. STD screening- patient opts in . Unprotected sex with fiancee  Status of chronic or acute concerns   GASTROESOPHAGEAL REFLUX DISEASE Follows with Dr. Carlean Purl. Has done really well on dexilant. States only 1 episode of reflux since then (after a night of drinking). Plans to see him in next few months.   Elevated blood pressure reading BP Readings from Last 3 Encounters:  08/13/16 138/72  11/03/14 136/80  10/18/14 (!) 146/88  would love to have this <130/80 so want to be really proactive on food choices and exercising  Target weight 200. Has been eating more fruits/veggies/whole grains lately- doing whole 30- lost 17 lbs last year with it. Then tried again but only lasted 10 days.   Right testicular pain S: went to see  urology within a year. At least 2 years of right testicular tightness/pain. Had ultrasound and was told normal except for some varicose veins on the right side. Worse after straining/stooling.    A/P:? Hernia per patient but do not feel any on exam. Will update std testing but biggest thing is to return to urology- strongly encouraged  Injury of right index finger Dr. Grandville Silos hand surgeon - cut tendon no surgery . cant do full fist with right pointer finger due to cut. left handed fortunately.  1 year CPE  Orders Placed This Encounter  Procedures  . CBC    [place]    Standing Status:   Future    Standing Expiration Date:   08/13/2017  . Comprehensive metabolic panel    [place]    Standing Status:   Future    Standing Expiration Date:   08/13/2017    Order Specific Question:   Has the patient fasted?    Answer:   No  . Lipid panel        Standing Status:  Future    Standing Expiration Date:   08/13/2017    Order Specific Question:   Has the patient fasted?    Answer:   No  . HIV antibody    [facility]    Standing Status:   Future    Standing Expiration Date:   08/13/2017  . RPR    [facility]    Standing Status:   Future    Standing Expiration Date:   08/13/2017   Return precautions advised.   Garret Reddish, MD

## 2016-08-13 NOTE — Progress Notes (Signed)
Pre visit review using our clinic review tool, if applicable. No additional management support is needed unless otherwise documented below in the visit note. 

## 2016-08-13 NOTE — Assessment & Plan Note (Signed)
Dr. Grandville Silos hand surgeon - cut tendon no surgery . cant do full fist with right pointer finger due to cut. left handed fortunately.

## 2016-08-24 ENCOUNTER — Ambulatory Visit (INDEPENDENT_AMBULATORY_CARE_PROVIDER_SITE_OTHER): Payer: Commercial Managed Care - PPO | Admitting: Physician Assistant

## 2016-08-24 ENCOUNTER — Encounter: Payer: Self-pay | Admitting: Physician Assistant

## 2016-08-24 VITALS — BP 136/78 | HR 124 | Temp 100.6°F | Resp 20 | Ht 72.75 in | Wt 226.0 lb

## 2016-08-24 DIAGNOSIS — J111 Influenza due to unidentified influenza virus with other respiratory manifestations: Secondary | ICD-10-CM | POA: Diagnosis not present

## 2016-08-24 MED ORDER — OSELTAMIVIR PHOSPHATE 75 MG PO CAPS: 75.0000 mg | ORAL_CAPSULE | Freq: Two times a day (BID) | ORAL | 0 refills | Status: DC

## 2016-08-24 MED ORDER — HYDROCODONE-ACETAMINOPHEN 5-325 MG PO TABS: 1.0000 | ORAL_TABLET | Freq: Four times a day (QID) | ORAL | 0 refills | Status: DC | PRN

## 2016-08-24 MED ORDER — HYDROCHLOROTHIAZIDE 25 MG PO TABS: 25.0000 mg | ORAL_TABLET | Freq: Every day | ORAL | 0 refills | Status: DC

## 2016-08-24 NOTE — Progress Notes (Signed)
Subjective:    Patient ID: Francis [name], male    DOB: 18-Nov-1985, 31 y.o.   MRN: [id number]  HPI  Francis [name] is a 31 y/o male who presents with a 2-day history of sore throat and 1-day history of fever 101.4 today, muscle aches, head pressure, chills. His symptoms today had sudden onset. His appetite is poor but he is trying to push fluids. He does not have a history of asthma. He has had walking PNA several years ago. He denies n/v/d, chest pain, SOB. He has not taken anything for his symptoms yet today.  Review of Systems  See HPI  Past Medical History:  Diagnosis Date  . Esophagitis, erosive 07/28/2009  . GERD (gastroesophageal reflux disease)      Social History   Social History  . Marital status: Single    Spouse name: N/A  . Number of children: N/A  . Years of education: N/A   Occupational History  . Not on file.   Social History Main Topics  . Smoking status: Former Smoker    Packs/day: 0.10    Years: 4.00    Types: Cigarettes    Quit date: 07/16/2010  . Smokeless tobacco: Never Used  . Alcohol use 3.6 - 4.8 oz/week    6 - 8 Standard drinks or equivalent per week     Comment: occ.  . Drug use: No  . Sexual activity: Not on file   Other Topics Concern  . Not on file   Social History Narrative   Single no children. Lives with fiancee- planned June 2018.       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: traveling/photography, outdoor activity.     Past Surgical History:  Procedure Laterality Date  . ESOPHAGOGASTRODUODENOSCOPY    . TONSILLECTOMY     young age    Family History  Problem Relation Age of Onset  . Healthy Mother   . Hypertension Father   . Rheum arthritis Father   . Lung cancer Maternal Grandfather   . Heart disease Paternal Grandfather   . CVA Maternal Grandmother     Allergies  Allergen Reactions  . Amoxicillin   . Penicillins     Current Outpatient Prescriptions on File Prior to  Visit  Medication Sig Dispense Refill  . dexlansoprazole (DEXILANT) 60 MG capsule Take 1 capsule (60 mg total) by mouth daily. 90 capsule 3   No current facility-administered medications on file prior to visit.     BP 136/78 (BP Location: Left Arm, Patient Position: Sitting, Cuff Size: Normal)   Pulse (!) 124   Temp (!) 100.6 F (38.1 C) (Oral)   Resp 20   Ht 6' 0.75" (1.848 m)   Wt 226 lb (102.5 kg)   SpO2 97%   BMI 30.02 kg/m      Objective:   Physical Exam  Constitutional: He appears well-developed and well-nourished. He is cooperative.  Non-toxic appearance. He does not have a sickly appearance. He does not appear ill. No distress.  HENT:  Head: Normocephalic and atraumatic.  Right Ear: Tympanic membrane, external ear and ear canal normal. Tympanic membrane is not erythematous, not retracted and not bulging.  Left Ear: Tympanic membrane, external ear and ear canal normal. Tympanic membrane is not erythematous, not retracted and not bulging.  Nose: Nose normal.  Mouth/Throat: Uvula is midline. Posterior oropharyngeal erythema present. No posterior oropharyngeal edema.  Cardiovascular: Regular rhythm and  normal heart sounds.  Tachycardia present.   Pulmonary/Chest: Effort normal and breath sounds normal. No accessory muscle usage. No respiratory distress.  Abdominal: Normal appearance.  Neurological: He is alert.  Skin: Skin is warm. He is diaphoretic.  Psychiatric: He has a normal mood and affect. His speech is normal and behavior is normal.  Nursing note and vitals reviewed.     Assessment & Plan:  1. Influenza Suspect Influenza given history and physical. Will treat with Tamiflu per orders. Advised patient to push fluids and maintain nutrition as able. Take ibuprofen as needed for body aches and fever, do not exceed recommended dosage on box. Patient was advised to call PCP office if he develops shortness of breath, worsening of fevers or lack of improvement despite  treatment. - oseltamivir (TAMIFLU) 75 MG capsule; Take 1 capsule (75 mg total) by mouth 2 (two) times daily.  Dispense: 10 capsule; Refill: 0  Inda Coke PA-C 08/24/16

## 2016-08-24 NOTE — Patient Instructions (Signed)
It was great meeting you today!  Please make sure that you stay hydrated and get plenty of rest. Control your fever with ibuprofen or tylenol, follow the package guidelines and do not exceed the recommended dose.  Take the Tamiflu as prescribed.  Please call your PCP if you develop any worsening fevers, sudden onset shortness of breath, inability to keep liquids down.   Influenza, Adult Influenza, more commonly known as "the flu," is a viral infection that primarily affects the respiratory tract. The respiratory tract includes organs that help you breathe, such as the lungs, nose, and throat. The flu causes many common cold symptoms, as well as a high fever and body aches. The flu spreads easily from person to person (is contagious). Getting a flu shot (influenza vaccination) every year is the best way to prevent influenza. What are the causes? Influenza is caused by a virus. You can catch the virus by:  Breathing in droplets from an infected person's cough or sneeze.  Touching something that was recently contaminated with the virus and then touching your mouth, nose, or eyes. What increases the risk? The following factors may make you more likely to get the flu:  Not cleaning your hands frequently with soap and water or alcohol-based hand sanitizer.  Having close contact with many people during cold and flu season.  Touching your mouth, eyes, or nose without washing or sanitizing your hands first.  Not drinking enough fluids or not eating a healthy diet.  Not getting enough sleep or exercise.  Being under a high amount of stress.  Not getting a yearly (annual) flu shot. You may be at a higher risk of complications from the flu, such as a severe lung infection (pneumonia), if you:  Are over the age of 89.  Are pregnant.  Have a weakened disease-fighting system (immune system). You may have a weakened immune system if you:  Have HIV or AIDS.  Are undergoing  chemotherapy.  Aretaking medicines that reduce the activity of (suppress) the immune system.  Have a long-term (chronic) illness, such as heart disease, kidney disease, diabetes, or lung disease.  Have a liver disorder.  Are obese.  Have anemia. What are the signs or symptoms? Symptoms of this condition typically last 4-10 days and may include:  Fever.  Chills.  Headache, body aches, or muscle aches.  Sore throat.  Cough.  Runny or congested nose.  Chest discomfort and cough.  Poor appetite.  Weakness or tiredness (fatigue).  Dizziness.  Nausea or vomiting. How is this diagnosed? This condition may be diagnosed based on your medical history and a physical exam. Your health care provider may do a nose or throat swab test to confirm the diagnosis. How is this treated? If influenza is detected early, you can be treated with antiviral medicine that can reduce the length of your illness and the severity of your symptoms. This medicine may be given by mouth (orally) or through an IV tube that is inserted in one of your veins. The goal of treatment is to relieve symptoms by taking care of yourself at home. This may include taking over-the-counter medicines, drinking plenty of fluids, and adding humidity to the air in your home. In some cases, influenza goes away on its own. Severe influenza or complications from influenza may be treated in a hospital. Follow these instructions at home:  Take over-the-counter and prescription medicines only as told by your health care provider.  Use a cool mist humidifier to add humidity to  the air in your home. This can make breathing easier.  Rest as needed.  Drink enough fluid to keep your urine clear or pale yellow.  Cover your mouth and nose when you cough or sneeze.  Wash your hands with soap and water often, especially after you cough or sneeze. If soap and water are not available, use hand sanitizer.  Stay home from work or  school as told by your health care provider. Unless you are visiting your health care provider, try to avoid leaving home until your fever has been gone for 24 hours without the use of medicine.  Keep all follow-up visits as told by your health care provider. This is important. How is this prevented?  Getting an annual flu shot is the best way to avoid getting the flu. You may get the flu shot in late summer, fall, or winter. Ask your health care provider when you should get your flu shot.  Wash your hands often or use hand sanitizer often.  Avoid contact with people who are sick during cold and flu season.  Eat a healthy diet, drink plenty of fluids, get enough sleep, and exercise regularly. Contact a health care provider if:  You develop new symptoms.  You have:  Chest pain.  Diarrhea.  A fever.  Your cough gets worse.  You produce more mucus.  You feel nauseous or you vomit. Get help right away if:  You develop shortness of breath or difficulty breathing.  Your skin or nails turn a bluish color.  You have severe pain or stiffness in your neck.  You develop a sudden headache or sudden pain in your face or ear.  You cannot stop vomiting. This information is not intended to replace advice given to you by your health care provider. Make sure you discuss any questions you have with your health care provider. Document Released: 06/29/2000 Document Revised: 12/08/2015 Document Reviewed: 04/26/2015 Elsevier Interactive Patient Education  2017 [employer].

## 2016-08-31 ENCOUNTER — Other Ambulatory Visit: Payer: Commercial Managed Care - PPO

## 2016-09-07 ENCOUNTER — Other Ambulatory Visit (HOSPITAL_COMMUNITY)
Admission: RE | Admit: 2016-09-07 | Discharge: 2016-09-07 | Disposition: A | Discharge status: Home / Self Care | Payer: Commercial Managed Care - PPO | Source: Ambulatory Visit | Attending: Family Medicine | Admitting: Family Medicine

## 2016-09-07 ENCOUNTER — Other Ambulatory Visit (INDEPENDENT_AMBULATORY_CARE_PROVIDER_SITE_OTHER): Payer: Commercial Managed Care - PPO

## 2016-09-07 DIAGNOSIS — Z0001 Encounter for general adult medical examination with abnormal findings: Secondary | ICD-10-CM

## 2016-09-07 DIAGNOSIS — Z7251 High risk heterosexual behavior: Secondary | ICD-10-CM

## 2016-09-07 DIAGNOSIS — Z113 Encounter for screening for infections with a predominantly sexual mode of transmission: Secondary | ICD-10-CM | POA: Insufficient documentation

## 2016-09-07 LAB — COMPREHENSIVE METABOLIC PANEL
ALT: 17 U/L (ref 0–53)
AST: 17 U/L (ref 0–37)
Albumin: 4.7 g/dL (ref 3.5–5.2)
Alkaline Phosphatase: 50 U/L (ref 39–117)
BUN: 13 mg/dL (ref 6–23)
CO2: 28 mEq/L (ref 19–32)
Calcium: 9.5 mg/dL (ref 8.4–10.5)
Chloride: 105 mEq/L (ref 96–112)
Creatinine, Ser: 0.91 mg/dL (ref 0.40–1.50)
GFR: 104.29 mL/min (ref >60.00)
Glucose, Bld: 85 mg/dL (ref 70–99)
POTASSIUM: 4.2 meq/L (ref 3.5–5.1)
SODIUM: 139 meq/L (ref 135–145)
Total Bilirubin: 0.7 mg/dL (ref 0.2–1.2)
Total Protein: 6.9 g/dL (ref 6.0–8.3)

## 2016-09-07 LAB — LIPID PANEL
CHOLESTEROL: 154 mg/dL (ref 0–200)
HDL: 31.9 mg/dL — AB (ref >39.00)
LDL CALC: 91 mg/dL (ref 0–99)
NonHDL: 122.09
TRIGLYCERIDES: 157 mg/dL — AB (ref 0.0–149.0)
Total CHOL/HDL Ratio: 5
VLDL: 31.4 mg/dL (ref 0.0–40.0)

## 2016-09-07 LAB — CBC
HEMATOCRIT: 45.1 % (ref 39.0–52.0)
HEMOGLOBIN: 15.4 g/dL (ref 13.0–17.0)
MCHC: 34.2 g/dL (ref 30.0–36.0)
MCV: 87.5 fl (ref 78.0–100.0)
Platelets: 320 10*3/uL (ref 150.0–400.0)
RBC: 5.15 Mil/uL (ref 4.22–5.81)
RDW: 12.9 % (ref 11.5–15.5)
WBC: 7.5 10*3/uL (ref 4.0–10.5)

## 2016-09-08 LAB — RPR

## 2016-09-08 LAB — HIV ANTIBODY (ROUTINE TESTING W REFLEX): HIV 1&2 Ab, 4th Generation: NONREACTIVE

## 2016-09-10 LAB — URINE CYTOLOGY ANCILLARY ONLY
Chlamydia: NEGATIVE
Neisseria Gonorrhea: NEGATIVE
Trichomonas: NEGATIVE

## 2016-09-11 ENCOUNTER — Encounter: Payer: Self-pay | Admitting: Family Medicine

## 2016-10-04 ENCOUNTER — Other Ambulatory Visit: Payer: Self-pay | Admitting: Internal Medicine

## 2016-10-04 DIAGNOSIS — K2100 Gastro-esophageal reflux disease with esophagitis, without bleeding: Secondary | ICD-10-CM

## 2016-12-29 ENCOUNTER — Other Ambulatory Visit: Payer: Self-pay | Admitting: Internal Medicine

## 2016-12-29 DIAGNOSIS — K2100 Gastro-esophageal reflux disease with esophagitis, without bleeding: Secondary | ICD-10-CM

## 2017-01-29 ENCOUNTER — Encounter: Payer: Self-pay | Admitting: Family Medicine

## 2017-01-29 ENCOUNTER — Ambulatory Visit (INDEPENDENT_AMBULATORY_CARE_PROVIDER_SITE_OTHER): Payer: Commercial Managed Care - PPO | Admitting: Family Medicine

## 2017-01-29 VITALS — BP 136/76 | HR 90 | Temp 98.2°F | Ht 72.75 in | Wt 212.4 lb

## 2017-01-29 DIAGNOSIS — A09 Infectious gastroenteritis and colitis, unspecified: Secondary | ICD-10-CM | POA: Diagnosis not present

## 2017-01-29 MED ORDER — CIPROFLOXACIN HCL 500 MG PO TABS: 500.0000 mg | ORAL_TABLET | Freq: Two times a day (BID) | ORAL | 0 refills | Status: DC

## 2017-01-29 NOTE — Patient Instructions (Signed)
antibiotic twice a day for 3 days (not a penicillin) for suspected traveler's diarrhea  Call me Friday morning or mychart me Thursday not if not significantly better and we would get you set up for stool studies including ova and parasites and stool culture as well as C. Diff (though suspect less likely)

## 2017-01-29 NOTE — Progress Notes (Signed)
Subjective:  Francis [name] is a 31 y.o. year old very pleasant male patient who presents for/with See problem oriented charting ROS- has had fever, chills. No vomiting. Mild nausea only- able to eat ok at this point.    Past Medical History-  Patient Active Problem List   Diagnosis Date Noted  . Elevated blood pressure reading 08/13/2016    Priority: Medium  . Right testicular pain 08/13/2016    Priority: Medium  . GASTROESOPHAGEAL REFLUX DISEASE 07/27/2009    Priority: Medium  . Injury of right index finger 08/13/2016    Priority: Low  . ABDOMINAL BLOATING 07/27/2009    Priority: Low  . Abnormal abdominal x-ray 07/27/2009    Priority: Low    Medications- reviewed and updated Current Outpatient Prescriptions  Medication Sig Dispense Refill  . DEXILANT 60 MG capsule TAKE 1 CAPSULE (60 MG TOTAL) BY MOUTH DAILY. 90 capsule 0   No current facility-administered medications for this visit.     Objective: BP 136/76 (BP Location: Left Arm, Patient Position: Sitting, Cuff Size: Large)   Pulse 90   Temp 98.2 F (36.8 C) (Oral)   Ht 6' 0.75" (1.848 m)   Wt 212 lb 6.4 oz (96.3 kg)   SpO2 97%   BMI 28.22 kg/m  Gen: NAD, resting comfortably Mucous membranes are moist. CV: RRR no murmurs rubs or gallops Lungs: CTAB no crackles, wheeze, rhonchi Abdomen: soft/very mild diffusely tender/nondistended/normal bowel sounds. No rebound or guarding.  Ext: no edema Skin: warm, dry  Assessment/Plan:  Traveler's diarrhea S: 6 days of watery diarrhea. Had fever for first day or two up to 101.5. Immodium helped with plane ride home had been overseas in [place], [place] for 2 weeks for honeymoon- luckily these issues only on last 2 days of trip. Drank bottled water mostly but did have some soup day before this started and wonders if that started things. No blood, mucus, or melena in regards to stool. Fever has resolved for several days. Having at least 4-5 BMs a day if not more.   Did  not eat any exotic foods. Did not swim in local lakes/rivers.  A/P: suspected traveler's diarrhea. We agreed to ciprofloxacin for 3 day treatment. If symptoms do not improve/resolve would do stool studies. No recent antibiotics but would check C. Diff. Would also do culture and ova and parasites given his travels though suspect low likelihood.   Meds ordered this encounter  Medications  . ciprofloxacin (CIPRO) 500 MG tablet    Sig: Take 1 tablet (500 mg total) by mouth 2 (two) times daily.    Dispense:  6 tablet    Refill:  0    Return precautions advised.  Garret Reddish, MD

## 2017-02-01 ENCOUNTER — Telehealth: Payer: Self-pay | Admitting: Family Medicine

## 2017-02-01 NOTE — Telephone Encounter (Signed)
° ° °  Pt said he was told to call and let Dr Yong Channel know on Friday how he was doing Pt said he is doing better and his stool is dark in color and foam.

## 2017-02-01 NOTE — Telephone Encounter (Signed)
Glad stool has improved. Assume instead of foam, firm was meant

## 2017-03-25 ENCOUNTER — Other Ambulatory Visit: Payer: Self-pay | Admitting: Internal Medicine

## 2017-03-25 ENCOUNTER — Telehealth: Payer: Self-pay | Admitting: Internal Medicine

## 2017-03-25 DIAGNOSIS — K2100 Gastro-esophageal reflux disease with esophagitis, without bleeding: Secondary | ICD-10-CM

## 2017-03-25 MED ORDER — DEXLANSOPRAZOLE 60 MG PO CPDR: 60.0000 mg | DELAYED_RELEASE_CAPSULE | Freq: Every day | ORAL | 0 refills | Status: DC

## 2017-03-25 NOTE — Telephone Encounter (Signed)
Dexilant refilled as patient has made appointment for November.

## 2017-05-22 ENCOUNTER — Ambulatory Visit: Payer: Commercial Managed Care - PPO | Admitting: Internal Medicine

## 2017-05-22 ENCOUNTER — Encounter: Payer: Self-pay | Admitting: Internal Medicine

## 2017-05-22 VITALS — BP 120/82 | HR 76 | Ht 79.5 in | Wt 222.6 lb

## 2017-05-22 DIAGNOSIS — K2100 Gastro-esophageal reflux disease with esophagitis, without bleeding: Secondary | ICD-10-CM

## 2017-05-22 MED ORDER — DEXLANSOPRAZOLE 60 MG PO CPDR: 60.0000 mg | DELAYED_RELEASE_CAPSULE | Freq: Every day | ORAL | 3 refills | Status: DC

## 2017-05-22 NOTE — Patient Instructions (Addendum)
   Try to not eat 3 hrs before lying down. Use Gaviscon at bedtime. Can elevate head of bed.  We have sent the following medications to your pharmacy for you to pick up at your convenience: Dexilant   I will look into the testing you need prior to surgery and let you know - if I have not let you know by late next week please call me back or message me on My Chart.  I appreciate the opportunity to care for you. Gatha Mayer, MD, [facility]

## 2017-05-22 NOTE — Progress Notes (Signed)
   Francis [name] 31 y.o. 13-Nov-1985 [id number]  Assessment & Plan:   Encounter Diagnosis  Name Primary?  . Gastroesophageal reflux disease with esophagitis Yes    He is interested in surgical Tx w/ fundoplication. ? If he needs pH study and Mano - will clarify w/ surgeon and refer.   He will also work on [employer] modifications We did dis  I appreciate the opportunity to care for this patient. [id number], Brayton Mars, MD   Subjective:   Chief Complaint: GERD  HPI Here for f/u - hx erosive esophagitis that improved on PPI - lately having some problems with evening or nocturnal regurgitation - is not 100% compliant with avoiding eating close to bedtime. No dysphagia.  Wt Readings from Last 3 Encounters:  05/22/17 222 lb 9.6 oz (101 kg)  01/29/17 212 lb 6.4 oz (96.3 kg)  08/24/16 226 lb (102.5 kg)    Allergies  Allergen Reactions  . Amoxicillin   . Penicillins    No outpatient medications have been marked as taking for the 05/22/17 encounter (Office Visit) with Gatha Mayer, MD.   Past Medical History:  Diagnosis Date  . Esophagitis, erosive 07/28/2009  . GERD (gastroesophageal reflux disease)    Past Surgical History:  Procedure Laterality Date  . ESOPHAGOGASTRODUODENOSCOPY    . TONSILLECTOMY     young age   Social History                                                     Tobacco Use  . Smoking status: Former Smoker    Packs/day: 0.10    Years: 4.00    Pack years: 0.40    Types: Cigarettes    Last attempt to quit: 07/16/2010    Years since quitting: 6.8  . Smokeless tobacco: Never Used  Substance and Sexual Activity  . Alcohol use: Yes    Alcohol/week: 3.6 - 4.8 oz    Types: 6 - 8 Standard drinks or equivalent per week    Comment: occ.  . Drug use: No            Social History Narrative   Single no children. Lives with fiancee- planned June 2018.       Works Best boy at [employer].   2 year degree at [employer]: traveling/photography, outdoor activity.    family history includes CVA in his maternal grandmother; Healthy in his mother; Heart disease in his paternal grandfather; Hypertension in his father; Lung cancer in his maternal grandfather; Rheum arthritis in his father.   Review of Systems  As above  Objective:   Physical Exam BP 120/82   Pulse 76   Ht 6' 7.5" (2.019 m)   Wt 222 lb 9.6 oz (101 kg)   BMI 24.76 kg/m   15 minutes time spent with patient > half in counseling coordination of care

## 2017-05-27 ENCOUNTER — Telehealth: Payer: Self-pay

## 2017-05-27 NOTE — Telephone Encounter (Signed)
Patient is scheduled for manometry at [facility] for 06/19/17 8:30.  I mailed him instructions Referral faxed to [facility] Dr. Rosendo Gros. Left message for patient to call back

## 2017-05-27 NOTE — Telephone Encounter (Signed)
-----   Message from Gatha Mayer, MD sent at 05/27/2017 12:01 PM EST ----- Regarding: needs referral Please refer to Dr. Ralene Ok re: GERD -evaluate for surgical tx  I have reviewed w/ Dr. Rosendo Gros - will do a mano but does not need a pH probe so please schedule manometry dx GERD - evaluating for fundoplication

## 2017-05-29 ENCOUNTER — Telehealth: Payer: Self-pay

## 2017-05-29 NOTE — Telephone Encounter (Signed)
Patient scheduled with [facility], Dr. Rosendo Gros on 06/10/17 at 9:20. Patient was contacted by [facility].

## 2017-06-05 NOTE — Telephone Encounter (Signed)
Patient is scheduled for 06/08/17 with Dr. Rosendo Gros and is aware of the appt details.

## 2017-06-10 ENCOUNTER — Ambulatory Visit: Payer: Self-pay | Admitting: General Surgery

## 2017-06-10 NOTE — H&P (Signed)
History of Present Illness Ralene Ok MD; 06/10/2017 9:46 AM) The patient is a 31 year old male who presents with a hiatal hernia. Referred by: Dr. Carlean Purl Chief Complaint: Reflux Patient is a 31 year old male with a a 10 year history of reflux issues. Patient states that he feels reflux at nighttime when laying down. He states he feels a majority of food come back up as well as some burning sensation. He states this is almost nightly. He states that he's had trials of different medications. He states that he is currently on Dexilent and this helped the most. He states that he has no issues with food going down, no chest pain, there are no other modifying factors.  Patient has had a previous endoscopy 2011 which revealed esophagitis. He did have small sliding 1-2 cm hiatal hernia.  Patient is scheduled for manometry next week.   Past Surgical History Alean Rinne, [place]; 06/10/2017 9:25 AM) Tonsillectomy   Diagnostic Studies History Alean Rinne, [place]; 06/10/2017 9:25 AM) Colonoscopy  never  Allergies Alean Rinne, [place]; 06/10/2017 9:26 AM) Penicillins  Allergies Reconciled   Medication History Alean Rinne, RMA; 06/10/2017 9:26 AM) Dexilant (60MG  Capsule DR, Oral) Active. Medications Reconciled  Social History Alean Rinne, [place]; 06/10/2017 9:25 AM) Alcohol use  Occasional alcohol use. Caffeine use  Carbonated beverages, Coffee, Tea. No drug use  Tobacco use  Former smoker.  Family History Alean Rinne, [place]; 06/10/2017 9:25 AM) Hypertension  Father.  Other Problems Alean Rinne, RMA; 06/10/2017 9:25 AM) Gastroesophageal Reflux Disease     Review of Systems Ralene Ok MD; 06/10/2017 9:44 AM) General Not Present- Appetite Loss, Chills, Fatigue, Fever, Night Sweats, Weight Gain and Weight Loss. Skin Not Present- Change in Wart/Mole, Dryness, Hives, Jaundice, New Lesions, Non-Healing Wounds, Rash and Ulcer. HEENT Present- Seasonal  Allergies and Wears glasses/contact lenses. Not Present- Earache, Hearing Loss, Hoarseness, Nose Bleed, Oral Ulcers, Ringing in the Ears, Sinus Pain, Sore Throat, Visual Disturbances and Yellow Eyes. Respiratory Present- Snoring. Not Present- Bloody sputum, Chronic Cough, Difficulty Breathing and Wheezing. Breast Not Present- Breast Mass, Breast Pain, Nipple Discharge and Skin Changes. Cardiovascular Not Present- Chest Pain, Difficulty Breathing Lying Down, Leg Cramps, Palpitations, Rapid Heart Rate, Shortness of Breath and Swelling of Extremities. Gastrointestinal Present- Bloating, Excessive gas and Indigestion. Not Present- Abdominal Pain, Bloody Stool, Change in Bowel Habits, Chronic diarrhea, Constipation, Difficulty Swallowing, Gets full quickly at meals, Hemorrhoids, Nausea, Rectal Pain and Vomiting. Male Genitourinary Not Present- Blood in Urine, Change in Urinary Stream, Frequency, Impotence, Nocturia, Painful Urination, Urgency and Urine Leakage. Musculoskeletal Not Present- Back Pain, Joint Pain, Joint Stiffness, Muscle Pain, Muscle Weakness and Swelling of Extremities. Neurological Not Present- Decreased Memory, Fainting, Headaches, Numbness, Seizures, Tingling, Tremor, Trouble walking and Weakness. Psychiatric Not Present- Anxiety, Bipolar, Change in Sleep Pattern, Depression, Fearful and Frequent crying. Endocrine Not Present- Cold Intolerance, Excessive Hunger, Hair Changes, Heat Intolerance, Hot flashes and New Diabetes. Hematology Not Present- Blood Thinners, Easy Bruising, Excessive bleeding, Gland problems, HIV and Persistent Infections. All other systems negative  Vitals Alean Rinne RMA; 06/10/2017 9:26 AM) 06/10/2017 9:25 AM Weight: 226.8 lb Height: 73in Body Surface Area: 2.27 m Body Mass Index: 29.92 kg/m  Temp.: 98.61F  Pulse: 118 (Regular)  BP: 140/80 (Sitting, Left Arm, Standard)       Assessment & Plan Ralene Ok MD; 06/10/2017 9:49  AM) GASTROESOPHAGEAL REFLUX DISEASE WITH ESOPHAGITIS (K21.0) Impression: 31 year old male with reflux, small hiatal hernia. Patient has been treating well with medical management however he is unwilling to  continue with lifelong treatment for his reflux. He states he is also concern about dysplasia. 1. Will follow-up as manometry test. I presume this will be in consistent with achalasia. 2. We'll proceed to the operating room for a robotic Nissen fundoplication. 3. I discussed with him the risks and benefits of the procedure to include but not limited to: Infection, bleeding, damage to surrounding structures, possible need for chest tube placement, possible delayed food intake, possible recurrence. Patient was understanding and wishes to proceed.   HIATAL HERNIA (K44.9)

## 2017-06-18 ENCOUNTER — Other Ambulatory Visit: Payer: Self-pay

## 2017-06-18 DIAGNOSIS — R1310 Dysphagia, unspecified: Secondary | ICD-10-CM

## 2017-06-18 DIAGNOSIS — R079 Chest pain, unspecified: Secondary | ICD-10-CM

## 2017-06-19 ENCOUNTER — Encounter (HOSPITAL_COMMUNITY)
Admission: RE | Disposition: A | Discharge status: Home / Self Care | Payer: Self-pay | Source: Ambulatory Visit | Attending: Internal Medicine

## 2017-06-19 ENCOUNTER — Encounter (HOSPITAL_COMMUNITY): Payer: Self-pay | Admitting: Internal Medicine

## 2017-06-19 ENCOUNTER — Ambulatory Visit (HOSPITAL_COMMUNITY)
Admission: RE | Admit: 2017-06-19 | Discharge: 2017-06-19 | Disposition: A | Discharge status: Home / Self Care | Payer: Commercial Managed Care - PPO | Source: Ambulatory Visit | Attending: Internal Medicine | Admitting: Internal Medicine

## 2017-06-19 DIAGNOSIS — K219 Gastro-esophageal reflux disease without esophagitis: Secondary | ICD-10-CM | POA: Diagnosis present

## 2017-06-19 HISTORY — PX: ESOPHAGEAL MANOMETRY: SHX5429

## 2017-06-19 SURGERY — MANOMETRY, ESOPHAGUS
Anesthesia: Topical

## 2017-06-19 MED ORDER — LIDOCAINE VISCOUS 2 % MT SOLN
OROMUCOSAL | Status: AC
  Filled 2017-06-19: qty 15

## 2017-06-19 SURGICAL SUPPLY — 2 items
FACESHIELD LNG OPTICON STERILE (SAFETY) IMPLANT
GLOVE BIO SURGEON STRL SZ8 (GLOVE) ×6 IMPLANT

## 2017-06-19 NOTE — Progress Notes (Signed)
Esophageal Manometry done per protocol. Pt tolerated well without distress. No complications noted.  Report to be sent to Dr. Celesta Aver office.

## 2017-06-21 ENCOUNTER — Telehealth: Payer: Self-pay

## 2017-06-21 NOTE — Telephone Encounter (Signed)
Patient notified

## 2017-06-21 NOTE — Telephone Encounter (Signed)
-----   Message from Gatha Mayer, MD sent at 06/21/2017  9:28 AM EST ----- Regarding: manometry results Please let him know that the esophageal manometry was not normal - esophagus does not squeeze properly - this impacts decision about fundoplication surgery - either not do it or modify it.  I have sent a message to Dr. Rosendo Gros of surgery also.  I can discuss at some point also - want to see what Dr. Rosendo Gros thinks

## 2017-06-27 NOTE — Progress Notes (Signed)
My chart message to patient regarding result Have communicated with Dr. Rosendo Gros, fundoplication not contraindicated, I have recommended considering a looser wrap to reduce risk of dysphagia in the future if patient decides to proceed with fundoplication

## 2017-07-18 ENCOUNTER — Ambulatory Visit: Payer: Self-pay | Admitting: General Surgery

## 2017-08-08 ENCOUNTER — Encounter: Payer: Self-pay | Admitting: Internal Medicine

## 2018-05-13 ENCOUNTER — Other Ambulatory Visit: Payer: Self-pay | Admitting: Internal Medicine

## 2018-05-13 DIAGNOSIS — K2100 Gastro-esophageal reflux disease with esophagitis, without bleeding: Secondary | ICD-10-CM

## 2018-11-12 ENCOUNTER — Telehealth: Payer: Self-pay | Admitting: Internal Medicine

## 2018-11-12 DIAGNOSIS — K2100 Gastro-esophageal reflux disease with esophagitis, without bleeding: Secondary | ICD-10-CM

## 2018-11-12 MED ORDER — DEXLANSOPRAZOLE 60 MG PO CPDR: DELAYED_RELEASE_CAPSULE | ORAL | 0 refills | Status: DC

## 2018-11-12 NOTE — Telephone Encounter (Signed)
Dexilant refilled as requested.

## 2019-01-31 ENCOUNTER — Other Ambulatory Visit: Payer: Self-pay | Admitting: Internal Medicine

## 2019-01-31 DIAGNOSIS — K2100 Gastro-esophageal reflux disease with esophagitis, without bleeding: Secondary | ICD-10-CM

## 2019-04-27 ENCOUNTER — Other Ambulatory Visit: Payer: Self-pay

## 2019-04-27 DIAGNOSIS — K2100 Gastro-esophageal reflux disease with esophagitis, without bleeding: Secondary | ICD-10-CM

## 2019-04-27 MED ORDER — DEXILANT 60 MG PO CPDR: DELAYED_RELEASE_CAPSULE | ORAL | 0 refills | Status: DC

## 2019-04-27 NOTE — Telephone Encounter (Signed)
Dexilant refilled as pharmacy requested and note attached that patient needs office visit before the refill runs out.

## 2019-07-21 ENCOUNTER — Other Ambulatory Visit: Payer: Self-pay | Admitting: Internal Medicine

## 2019-07-21 DIAGNOSIS — K2100 Gastro-esophageal reflux disease with esophagitis, without bleeding: Secondary | ICD-10-CM

## 2019-07-23 ENCOUNTER — Telehealth: Payer: Self-pay | Admitting: Internal Medicine

## 2019-07-23 DIAGNOSIS — K2100 Gastro-esophageal reflux disease with esophagitis, without bleeding: Secondary | ICD-10-CM

## 2019-07-23 MED ORDER — DEXILANT 60 MG PO CPDR: 60.0000 mg | DELAYED_RELEASE_CAPSULE | Freq: Every day | ORAL | 0 refills | Status: DC

## 2019-07-23 NOTE — Telephone Encounter (Signed)
Patient is calling requesting a refill for dexilant. I told him that he needs an OV because he is out of refills and he stated that he just doesnt think that is an option right now with his daily life but needs it refilled. Told him I would send the message to Dr. Carlean Purl.

## 2019-07-23 NOTE — Telephone Encounter (Signed)
Dexilant prior authorization done thru [place] and approved. I put a note on it when I sent it in that he must call for an appointment.

## 2019-10-12 ENCOUNTER — Telehealth: Payer: Self-pay | Admitting: Internal Medicine

## 2019-10-12 DIAGNOSIS — K2100 Gastro-esophageal reflux disease with esophagitis, without bleeding: Secondary | ICD-10-CM

## 2019-10-12 MED ORDER — DEXILANT 60 MG PO CPDR: 60.0000 mg | DELAYED_RELEASE_CAPSULE | Freq: Every day | ORAL | 0 refills | Status: DC

## 2019-10-12 NOTE — Addendum Note (Signed)
Addended by: [place], Nariah Morgano E on: 10/12/2019 02:55 PM   Modules accepted: Orders

## 2019-10-12 NOTE — Telephone Encounter (Signed)
I did a prior authorization thru [place] for patients Dexilant 60mg  capsules for his GERD K21.0. This has been approved thru 10/11/2020. Patient informed.

## 2019-10-14 ENCOUNTER — Telehealth: Payer: Self-pay | Admitting: Internal Medicine

## 2019-10-14 NOTE — Telephone Encounter (Signed)
I have updated pharmacy list.

## 2019-12-01 ENCOUNTER — Ambulatory Visit: Payer: BC Managed Care – PPO | Admitting: Internal Medicine

## 2019-12-01 ENCOUNTER — Encounter: Payer: Self-pay | Admitting: Internal Medicine

## 2019-12-01 VITALS — BP 122/80 | HR 88 | Ht 73.0 in | Wt 225.1 lb

## 2019-12-01 DIAGNOSIS — K032 Erosion of teeth: Secondary | ICD-10-CM | POA: Diagnosis not present

## 2019-12-01 DIAGNOSIS — K2100 Gastro-esophageal reflux disease with esophagitis, without bleeding: Secondary | ICD-10-CM

## 2019-12-01 DIAGNOSIS — K224 Dyskinesia of esophagus: Secondary | ICD-10-CM

## 2019-12-01 NOTE — Patient Instructions (Signed)
  You have been scheduled for an endoscopy. Please follow written instructions given to you at your visit today. If you use inhalers (even only as needed), please bring them with you on the day of your procedure.   I appreciate the opportunity to care for you. Carl Gessner, MD, FACG 

## 2019-12-01 NOTE — Progress Notes (Signed)
Francis [name] 34 y.o. [birth year] CV:[id number]  Assessment & Plan:   Encounter Diagnoses  Name Primary?  . Gastroesophageal reflux disease with esophagitis without hemorrhage Yes  . Esophageal dysmotility   . Dental erosion     We have decided to perform EGD to check on status of esophageal mucosa, i.e. does he have persistent esophagitis.  If he does would consider medication change.  Further plans pending that.  He has esophageal dysmotility as outlined below, I do not know if that is related to his inability to belch or burp but it might be.  He was interested in and almost had fundoplication surgery in [id number] but we held off due to the ineffective esophageal motility.  Consider tertiary evaluation he remains interested in surgery though I have concerns about postoperative side effects and him based upon the esophageal dysmotility and inability to burp.  He used to have dysphagia, and I wonder if his ineffective esophageal dysmotility is not from GERD.  A gastric emptying study was normal years ago.  Continue Dexilant 60 mg daily for now.  I do not think TIF is possible here.  I think his hiatal hernia is too large at 2 to 3 cm.  Additionally the motility issues come into play as well.  Note that I do not detect any signs or symptoms of connective tissue disease, we discussed how sometimes esophageal dysmotility is one manifestation of those.  His father did or does have rheumatoid arthritis.  CC: Marin Olp, MD   Subjective:   Chief Complaint: GERD follow-up unable to belch dental erosions  HPI Francis is a 34 year old man with a long history of esophageal reflux diagnosed about 10 years ago at EGD with erosive esophagitis, response to PPI on Dexilant 60 mg daily but once a month or so he may have problems with reflux to take if he eats late or overeats or has more alcohol than usual.  It is only when he lies down at night.  He continues to have difficulty with an  inability to burp or belch and when he has carbonated beverages or beer that causes some distress.  He said he looked up online that there was a trial somewhere where Botox was injected into the esophagus with relief for people unable to belch.  He remains interested in having surgery rather than taking medications for reflux, we reviewed the issues with that when he had his manometry it ineffective esophageal motility with poor bolus clearance found in 2018.  Additionally, he had a dental checkup recently and was told he had multiple dental erosions question related to reflux.  He does not really have heartburn except when he has the episodes described above.  Did have a gastroenteritis type situation within the past few months where he vomited several days and then for about a week he had some odynophagia that spontaneously resolved.  1 caffeinated beverage daily 1 alcoholic beverage daily.  Wt Readings from Last 3 Encounters:  12/01/19 225 lb 2 oz (102.1 kg)  05/22/17 222 lb 9.6 oz (101 kg)  01/29/17 212 lb 6.4 oz (96.3 kg)   He does not have any signs or symptoms of connective tissue disease that I can discern Allergies  Allergen Reactions  . Amoxicillin   . Penicillins    Current Meds  Medication Sig  . dexlansoprazole (DEXILANT) 60 MG capsule Take 1 capsule (60 mg total) by mouth daily before breakfast.   Past Medical History:  Diagnosis Date  .  Esophagitis, erosive 07/28/2009  . GERD (gastroesophageal reflux disease)    Past Surgical History:  Procedure Laterality Date  . ESOPHAGEAL MANOMETRY N/A 06/19/2017   Procedure: ESOPHAGEAL MANOMETRY (EM);  Surgeon: Gatha Mayer, MD;  Location: [facility];  Service: Endoscopy;  Laterality: N/A;  . ESOPHAGOGASTRODUODENOSCOPY    . 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       1 caffeine/day   Former smoker   1 EtOH/day   family history includes CVA in his maternal grandmother; Healthy in his mother; Heart disease in his paternal grandfather; Hypertension in his father; Lung cancer in his maternal grandfather; Pancreatic cancer in his paternal aunt; Rheum arthritis in his father.   Review of Systems As above  Objective:   Physical Exam BP 122/80   Pulse 88   Ht 6\' 1"  (1.854 m)   Wt 225 lb 2 oz (102.1 kg)   BMI 29.70 kg/m  Well-developed well-nourished no acute distress Lungs clear Heart sounds normal

## 2019-12-23 ENCOUNTER — Encounter: Payer: Self-pay | Admitting: Certified Registered Nurse Anesthetist

## 2019-12-24 ENCOUNTER — Encounter: Payer: Self-pay | Admitting: Internal Medicine

## 2019-12-24 ENCOUNTER — Ambulatory Visit (AMBULATORY_SURGERY_CENTER): Payer: BC Managed Care – PPO | Admitting: Internal Medicine

## 2019-12-24 ENCOUNTER — Other Ambulatory Visit: Payer: Self-pay

## 2019-12-24 VITALS — BP 117/79 | HR 75 | Temp 97.3°F | Resp 15 | Ht 73.0 in | Wt 225.0 lb

## 2019-12-24 DIAGNOSIS — K317 Polyp of stomach and duodenum: Secondary | ICD-10-CM | POA: Diagnosis not present

## 2019-12-24 DIAGNOSIS — K2100 Gastro-esophageal reflux disease with esophagitis, without bleeding: Secondary | ICD-10-CM

## 2019-12-24 DIAGNOSIS — K219 Gastro-esophageal reflux disease without esophagitis: Secondary | ICD-10-CM | POA: Diagnosis not present

## 2019-12-24 DIAGNOSIS — K224 Dyskinesia of esophagus: Secondary | ICD-10-CM | POA: Diagnosis not present

## 2019-12-24 DIAGNOSIS — K228 Other specified diseases of esophagus: Secondary | ICD-10-CM | POA: Diagnosis not present

## 2019-12-24 DIAGNOSIS — D130 Benign neoplasm of esophagus: Secondary | ICD-10-CM | POA: Diagnosis not present

## 2019-12-24 MED ORDER — SODIUM CHLORIDE 0.9 % IV SOLN: 500.0000 mL | Freq: Once | INTRAVENOUS | Status: DC

## 2019-12-24 NOTE — Progress Notes (Signed)
Report given to PACU, vss 

## 2019-12-24 NOTE — Progress Notes (Signed)
Called to room to assist during endoscopic procedure.  Patient ID and intended procedure confirmed with present staff. Received instructions for my participation in the procedure from the performing physician.  

## 2019-12-24 NOTE — Patient Instructions (Addendum)
No inflammation in the esophagus.  I did see a tiny polyp at the junction of the esophagus and stomach and also some stomach polyps.  These all look innocent but we will get them checked and I will contact you.  It could be worthwhile to repeat an esophageal manometry to see if there are changes in the esophageal function that would lead to treatment different from what we have done.  I appreciate the opportunity to care for you. Gatha Mayer, MD, FACG  YOU HAD AN ENDOSCOPIC PROCEDURE TODAY AT [facility]:   Refer to the procedure report that was given to you for any specific questions about what was found during the examination.  If the procedure report does not answer your questions, please call your gastroenterologist to clarify.  If you requested that your care partner not be given the details of your procedure findings, then the procedure report has been included in a sealed envelope for you to review at your convenience later.  YOU SHOULD EXPECT: Some feelings of bloating in the abdomen. Passage of more gas than usual.  Walking can help get rid of the air that was put into your GI tract during the procedure and reduce the bloating. If you had a lower endoscopy (such as a colonoscopy or flexible sigmoidoscopy) you may notice spotting of blood in your stool or on the toilet paper. If you underwent a bowel prep for your procedure, you may not have a normal bowel movement for a few days.  Please Note:  You might notice some irritation and congestion in your nose or some drainage.  This is from the oxygen used during your procedure.  There is no need for concern and it should clear up in a day or so.  SYMPTOMS TO REPORT IMMEDIATELY:   Following upper endoscopy (EGD)  Vomiting of blood or coffee ground material  New chest pain or pain under the shoulder blades  Painful or persistently difficult swallowing  New shortness of breath  Fever of 100F or higher  Black,  tarry-looking stools  For urgent or emergent issues, a gastroenterologist can be reached at any hour by calling [phone]. Do not use MyChart messaging for urgent concerns.    DIET:  We do recommend a small meal at first, but then you may proceed to your regular diet.  Drink plenty of fluids but you should avoid alcoholic beverages for 24 hours.  ACTIVITY:  You should plan to take it easy for the rest of today and you should NOT DRIVE or use heavy machinery until tomorrow (because of the sedation medicines used during the test).    FOLLOW UP: Our staff will call the number listed on your records 48-72 hours following your procedure to check on you and address any questions or concerns that you may have regarding the information given to you following your procedure. If we do not reach you, we will leave a message.  We will attempt to reach you two times.  During this call, we will ask if you have developed any symptoms of COVID 19. If you develop any symptoms (ie: fever, flu-like symptoms, shortness of breath, cough etc.) before then, please call [phone].  If you test positive for Covid 19 in the 2 weeks post procedure, please call and report this information to [place].    If any biopsies were taken you will be contacted by phone or by letter within the next 1-3 weeks.  Please call us at [phone] if you have not heard about the biopsies in 3 weeks.    SIGNATURES/CONFIDENTIALITY: You and/or your care partner have signed paperwork which will be entered into your electronic medical record.  These signatures attest to the fact that that the information above on your After Visit Summary has been reviewed and is understood.  Full responsibility of the confidentiality of this discharge information lies with you and/or your care-partner.

## 2019-12-24 NOTE — Op Note (Addendum)
[facility] Patient Name: Francis [name] Procedure Date: 12/24/2019 9:55 AM MRN: [id number] Endoscopist: Gatha Mayer , MD Age: 34 Referring MD:  Date of Birth: 03-07-1986 Gender: Male Account #: [id number] Procedure:                Upper GI endoscopy Indications:              Esophageal reflux, Follow-up of esophageal reflux Medicines:                Propofol per Anesthesia, Monitored Anesthesia Care Procedure:                Pre-Anesthesia Assessment:                           - Prior to the procedure, a History and Physical                            was performed, and patient medications and                            allergies were reviewed. The patient's tolerance of                            previous anesthesia was also reviewed. The risks                            and benefits of the procedure and the sedation                            options and risks were discussed with the patient.                            All questions were answered, and informed consent                            was obtained. Prior Anticoagulants: The patient has                            taken no previous anticoagulant or antiplatelet                            agents. ASA Grade Assessment: II - A patient with                            mild systemic disease. After reviewing the risks                            and benefits, the patient was deemed in                            satisfactory condition to undergo the procedure.                           After obtaining informed consent, the endoscope was  passed under direct vision. Throughout the                            procedure, the patient's blood pressure, pulse, and                            oxygen saturations were monitored continuously. The                            Endoscope was introduced through the mouth, and                            advanced to the second part of duodenum. The upper                             GI endoscopy was accomplished without difficulty.                            The patient tolerated the procedure well. Scope In: Scope Out: Findings:                 The examined esophagus was mildly tortuous.                           A single 3 mm polyp was found at the                            gastroesophageal junction. Biopsies were taken with                            a cold forceps for histology. Verification of                            patient identification for the specimen was done.                            Estimated blood loss was minimal.                           Multiple small semi-sessile polyps were found in                            the gastric fundus and in the gastric body.                            Biopsies were taken with a cold forceps for                            histology. Verification of patient identification                            for the specimen was done. Estimated blood loss was  minimal.                           The gastroesophageal flap valve was visualized                            endoscopically and classified as Hill Grade III                            (minimal fold, loose to endoscope, hiatal hernia                            likely).                           The exam was otherwise without abnormality.                           The cardia and gastric fundus were normal on                            retroflexion. Complications:            No immediate complications. Estimated Blood Loss:     Estimated blood loss was minimal. Impression:               - Tortuous esophagus. ? represents dysmotility                           - Gastroesophageal junction polyp found. Biopsied.                           - Multiple gastric polyps. Biopsied. look like                            innocent fundic gland polyps                           - Gastroesophageal flap valve classified as Hill                             Grade III (minimal fold, loose to endoscope, hiatal                            hernia likely).                           - The examination was otherwise normal. Recommendation:           - Patient has a contact number available for                            emergencies. The signs and symptoms of potential                            delayed complications were discussed with the  patient. Return to normal activities tomorrow.                            Written discharge instructions were provided to the                            patient.                           - Resume previous diet.                           - Continue present medications.                           - Await pathology results.                           - Consider repeat manometry to reassess esophageal                            motielity (ineffective in 2018 - which led to not                            Nissen procedure)                           - Consider tertiary eval - he is interested                           - Hiatal hernia suspecvted today but was not obvious Gatha Mayer, MD 12/24/2019 10:25:11 AM This report has been signed electronically.

## 2019-12-24 NOTE — Progress Notes (Signed)
Pt's states no medical or surgical changes since previsit or office visit. 

## 2019-12-28 ENCOUNTER — Telehealth: Payer: Self-pay | Admitting: *Deleted

## 2019-12-28 ENCOUNTER — Encounter: Payer: Self-pay | Admitting: Internal Medicine

## 2019-12-28 ENCOUNTER — Telehealth: Payer: Self-pay

## 2019-12-28 NOTE — Telephone Encounter (Signed)
Attempted to reach pt. With follow-up call following endoscopic procedure 12/24/2019.  LM on pt. Voice mail.  Will try to reach pt. Again later today.

## 2019-12-28 NOTE — Telephone Encounter (Signed)
Attempted 2nd f/u phone call. No answer. Left message.  °

## 2019-12-29 ENCOUNTER — Other Ambulatory Visit: Payer: Self-pay

## 2019-12-29 DIAGNOSIS — K2100 Gastro-esophageal reflux disease with esophagitis, without bleeding: Secondary | ICD-10-CM

## 2019-12-29 MED ORDER — DEXILANT 60 MG PO CPDR: 60.0000 mg | DELAYED_RELEASE_CAPSULE | Freq: Every day | ORAL | 3 refills | Status: DC

## 2020-01-25 DIAGNOSIS — K219 Gastro-esophageal reflux disease without esophagitis: Secondary | ICD-10-CM | POA: Diagnosis not present

## 2020-01-25 DIAGNOSIS — K2100 Gastro-esophageal reflux disease with esophagitis, without bleeding: Secondary | ICD-10-CM | POA: Diagnosis not present

## 2020-03-08 ENCOUNTER — Other Ambulatory Visit: Payer: Self-pay | Admitting: Internal Medicine

## 2020-03-08 DIAGNOSIS — K2100 Gastro-esophageal reflux disease with esophagitis, without bleeding: Secondary | ICD-10-CM

## 2020-04-05 ENCOUNTER — Telehealth: Payer: BC Managed Care – PPO | Admitting: Family Medicine

## 2020-04-05 ENCOUNTER — Encounter: Payer: Self-pay | Admitting: Family Medicine

## 2020-04-05 VITALS — Temp 97.9°F

## 2020-04-05 DIAGNOSIS — R0981 Nasal congestion: Secondary | ICD-10-CM

## 2020-04-05 DIAGNOSIS — J029 Acute pharyngitis, unspecified: Secondary | ICD-10-CM | POA: Diagnosis not present

## 2020-04-05 NOTE — Progress Notes (Signed)
Virtual Visit via Video Note  I connected with Francis [name]  on 04/05/20 at  1:00 PM EDT by a video enabled telemedicine application and verified that I am speaking with the correct person using two identifiers.  Location patient: home, [place] Location provider:work or home office Persons participating in the virtual visit: patient, provider  I discussed the limitations of evaluation and management by telemedicine and the availability of in person appointments. The patient expressed understanding and agreed to proceed.   HPI:  Acute telemedicine visit for Sore throat, congestion: -Onset: 04/02/2020 -Symptoms include: sinus congestion, sore throat, fever - high of 102.6, chills, body aches initially, HA, mild cough -temp 97.9 currently -reports covid19 test is scheduled for tomorrow -Denies: NVD, CP, SOB, loss of taste, inability to get out of bed/eat/drink, dysphagia -Has tried: ibuprofen -wife and 15 yo have been sick too with a "cold"  - wife was negative for covid19 -Pertinent past medical history: GERD, overweight per chart -Pertinent medication allergies: penicillins, amoxicillins -COVID-19 vaccine status: fully vaccinated with pfizer, 2nd dose in April   ROS: See pertinent positives and negatives per HPI.  Past Medical History:  Diagnosis Date  . Esophagitis, erosive 07/28/2009  . Fundic gland polyps of stomach, benign   . GERD (gastroesophageal reflux disease)     Past Surgical History:  Procedure Laterality Date  . ESOPHAGEAL MANOMETRY N/A 06/19/2017   Procedure: ESOPHAGEAL MANOMETRY (EM);  Surgeon: Gatha Mayer, MD;  Location: [facility];  Service: Endoscopy;  Laterality: N/A;  . ESOPHAGOGASTRODUODENOSCOPY    . TONSILLECTOMY     young age     Current Outpatient Medications:  .  DEXILANT 60 MG capsule, TAKE 1 CAPSULE (60 MG TOTAL) BY MOUTH DAILY BEFORE BREAKFAST, Disp: 90 capsule, Rfl: 3  EXAM:  VITALS per patient if applicable:  GENERAL: alert, oriented, appears  well and in no acute distress  HEENT: atraumatic, conjunttiva clear, no obvious abnormalities on inspection of external nose and ears, on video visit inspection of the oropharynx - mild post oropharyngeal erythema without appreciable tonsillar edema or exudate  NECK: normal movements of the head and neck  LUNGS: on inspection no signs of respiratory distress, breathing rate appears normal, no obvious gross SOB, gasping or wheezing  CV: no obvious cyanosis  MS: moves all visible extremities without noticeable abnormality  PSYCH/NEURO: pleasant and cooperative, no obvious depression or anxiety, speech and thought processing grossly intact  ASSESSMENT AND PLAN:  Discussed the following assessment and plan:  Sore throat  Sinus congestion  -we discussed possible serious and likely etiologies, options for evaluation and workup, limitations of telemedicine visit vs in person visit, treatment, treatment risks and precautions. Pt prefers to treat via telemedicine empirically rather then risking or undertaking an in person visit at this moment.  Suspect viral upper respiratory illness, possible influenza, possible breakthrough Covid versus other.  Patient has scheduled a test for Covid tomorrow.  He requested a strep test.  Symptoms do not seem classic for strep, however due to his concern discussed options for testing.  Discussed symptomatic care with nasal saline, short course of Afrin, Aleve, salt water gargles and staying hydrated.  Discussed treatment options for various potential etiologies, potential complications and precautions.  Advised staying home while sick, and per CDC if Covid test is positive. Work/School slipped offered: provided in patient instructions  Scheduled follow up with PCP offered: Sent message to schedulers to assist and advised patient to contact PCP office to schedule if does not receive call back in  next 24 hours. Advised to seek prompt follow up telemedicine visit or in  person care if worsening, new symptoms arise, or if is not improving with treatment. Did let this patient know that I only do telemedicine on Tuesdays and Thursdays for [place]. Advised to schedule follow up visit with PCP or [facility] if any further questions or concerns to avoid delays in care.   I discussed the assessment and treatment plan with the patient. The patient was provided an opportunity to ask questions and all were answered. The patient agreed with the plan and demonstrated an understanding of the instructions.     Lucretia Kern, DO

## 2020-04-05 NOTE — Patient Instructions (Addendum)
   ---------------------------------------------------------------------------------------------------------------------------      WORK SLIP:  Patient Francis [name],  Mar 27, 1986, was seen for a medical visit today, 04/05/20 . Please excuse from work according to the [facility] guidelines for a COVID like illness. We advise 10 days minimum from the onset of symptoms (04/02/2020) PLUS 1 day of no fever and improved symptoms. Will defer to employer for a sooner return to work if [place] testing is negative and the symptoms have resolved. Advise following CDC guidelines.   Sincerely: E-signature: Dr. Colin Benton, DO [place] Ph: [phone]   ------------------------------------------------------------------------------------------------------------------------------   Home Care Tips:  -stay home while sick, and if you have [place] please stay home for a full 10 days since the onset of symptoms PLUS one day of no fever and feeling better  -Get the Covid testing as planned  -If you decide that you wish to test for strep, options would include the [facility] or urgent cares.  Some primary care offices can also test for strep, but you would need to call ahead to request and schedule a testing appointment.  -can use tylenol or aleve if needed for fevers, aches and pains per instructions  -can use nasal saline a few times per day if nasal congestion, sometime a short course of Afrin nasal spray for 3 days can help as well  -Warm salt water gargles can help with the sore throat  -stay hydrated, drink plenty of fluids and eat small healthy meals - avoid dairy  -can take 1000 IU Vit D3 and Vit C lozenges per instructions   -follow up with your doctor in 2-3 days unless improving and feeling better  I hope you are feeling better soon! Seek in-person care or a follow up telemedicine visit promptly if your symptoms worsen, new concerns arise or you are not  improving as expected. Call 911 if severe symptoms.

## 2020-04-06 ENCOUNTER — Telehealth: Payer: Self-pay | Admitting: Family Medicine

## 2020-04-06 NOTE — Telephone Encounter (Signed)
FYI

## 2020-04-06 NOTE — Progress Notes (Signed)
Can we schedule patient in a same day for Dr.Hunter?

## 2020-04-06 NOTE — Telephone Encounter (Signed)
Noted  

## 2020-04-06 NOTE — Telephone Encounter (Signed)
Called patient to get scheduled for a follow up appointment, spoke with Francis [name] and was advised to schedule him on Tuesday, but patient states that he is feeling a bit better and is going to get COVID tested, Francis [name] will call back with results.

## 2020-04-06 NOTE — Telephone Encounter (Signed)
Patient is informing us with covid test results they are negative and believes he contracted RSV from his daughter who tested positive for RSV today

## 2020-04-07 NOTE — Telephone Encounter (Signed)
Patient notified via my chart message.

## 2020-04-07 NOTE — Telephone Encounter (Signed)
Glad covid test is negative. RSV can linger for a while and is contagious  People infected with RSV are usually contagious for 3 to 8 days so would avoid being around other kids or immunosuppressed people in this time frame

## 2020-05-31 ENCOUNTER — Other Ambulatory Visit: Payer: Self-pay

## 2020-05-31 MED ORDER — DEXLANSOPRAZOLE 60 MG PO CPDR: 60.0000 mg | DELAYED_RELEASE_CAPSULE | Freq: Every day | ORAL | 3 refills | Status: DC

## 2020-07-07 ENCOUNTER — Other Ambulatory Visit: Payer: Self-pay

## 2020-07-07 ENCOUNTER — Encounter: Payer: Self-pay | Admitting: Family Medicine

## 2020-07-07 ENCOUNTER — Telehealth (INDEPENDENT_AMBULATORY_CARE_PROVIDER_SITE_OTHER): Payer: BC Managed Care – PPO | Admitting: Family Medicine

## 2020-07-07 VITALS — Ht 73.0 in | Wt 225.0 lb

## 2020-07-07 DIAGNOSIS — J329 Chronic sinusitis, unspecified: Secondary | ICD-10-CM | POA: Diagnosis not present

## 2020-07-07 MED ORDER — AZELASTINE HCL 0.1 % NA SOLN: 2.0000 | Freq: Two times a day (BID) | NASAL | 12 refills | Status: DC

## 2020-07-07 MED ORDER — AZITHROMYCIN 250 MG PO TABS: ORAL_TABLET | ORAL | 0 refills | Status: DC

## 2020-07-07 NOTE — Progress Notes (Signed)
   Francis [name] is a 34 y.o. male who presents today for a virtual office visit.  Assessment/Plan:  Sinusitis Given length of symptoms will start antibiotics.  Azithromycin has worked well for him for similar infections in the past. We will send this in today.  We will also start Astelin nasal spray.  He can continue over-the-counter meds.  Encourage good oral hydration.  Discussed reasons to return to care.    Subjective:  HPI:  Symptoms started 3-4 weeks ago. Stable over the last few weeks. No fevers or chills.   Feels very similar to prior sinus infections.  No reported cough or shortness of breath.  Has a lot of facial pressure.  More sinus congestion.  Over-the-counter meds and vitamins have not helped significantly.       Objective/Observations  Physical Exam: Gen: NAD, resting comfortably Pulm: Normal work of breathing Neuro: Grossly normal, moves all extremities Psych: Normal affect and thought content  Virtual Visit via Video   I connected with Francis [name] on 07/07/20 at 11:20 AM EST by a video enabled telemedicine application and verified that I am speaking with the correct person using two identifiers. The limitations of evaluation and management by telemedicine and the availability of in person appointments were discussed. The patient expressed understanding and agreed to proceed.   Patient location: Home Provider location: [place] participating in the virtual visit: Myself and Patient     Algis Greenhouse. Jerline Pain, MD 07/07/2020 11:35 AM

## 2020-07-28 DIAGNOSIS — Z1152 Encounter for screening for COVID-19: Secondary | ICD-10-CM | POA: Diagnosis not present

## 2020-08-22 ENCOUNTER — Telehealth: Payer: Self-pay

## 2020-08-22 ENCOUNTER — Other Ambulatory Visit: Payer: Self-pay

## 2020-08-22 MED ORDER — DEXLANSOPRAZOLE 60 MG PO CPDR: 60.0000 mg | DELAYED_RELEASE_CAPSULE | Freq: Every day | ORAL | 0 refills | Status: DC

## 2020-08-22 NOTE — Telephone Encounter (Signed)
I have started a prior authorization thru [employer] for patient's dexilant 60mg  capsules, one daily for his GERD K21.9 . Will await the outcome.

## 2020-08-23 NOTE — Telephone Encounter (Signed)
His medication has been approved thru 08/21/2021. Pharmacy informed.

## 2020-08-29 ENCOUNTER — Other Ambulatory Visit: Payer: Self-pay

## 2020-08-29 MED ORDER — DEXLANSOPRAZOLE 60 MG PO CPDR
60.0000 mg | DELAYED_RELEASE_CAPSULE | Freq: Every day | ORAL | 0 refills | Status: DC
Start: 2020-08-29 — End: 2020-10-31

## 2020-08-29 NOTE — Telephone Encounter (Signed)
I have refilled generic dexilant for Francis [name] as requested in his [facility] message.

## 2020-10-03 DIAGNOSIS — L720 Epidermal cyst: Secondary | ICD-10-CM | POA: Diagnosis not present

## 2020-10-03 DIAGNOSIS — D225 Melanocytic nevi of trunk: Secondary | ICD-10-CM | POA: Diagnosis not present

## 2020-10-03 DIAGNOSIS — D2262 Melanocytic nevi of left upper limb, including shoulder: Secondary | ICD-10-CM | POA: Diagnosis not present

## 2020-10-03 DIAGNOSIS — D2261 Melanocytic nevi of right upper limb, including shoulder: Secondary | ICD-10-CM | POA: Diagnosis not present

## 2020-10-20 DIAGNOSIS — L720 Epidermal cyst: Secondary | ICD-10-CM | POA: Diagnosis not present

## 2020-10-31 ENCOUNTER — Other Ambulatory Visit: Payer: Self-pay

## 2020-10-31 MED ORDER — DEXLANSOPRAZOLE 60 MG PO CPDR
60.0000 mg | DELAYED_RELEASE_CAPSULE | Freq: Every day | ORAL | 0 refills | Status: DC
Start: 2020-10-31 — End: 2020-11-03

## 2020-11-03 ENCOUNTER — Other Ambulatory Visit: Payer: Self-pay | Admitting: Internal Medicine

## 2020-11-03 MED ORDER — DEXLANSOPRAZOLE 60 MG PO CPDR
60.0000 mg | DELAYED_RELEASE_CAPSULE | Freq: Every day | ORAL | 3 refills | Status: DC
Start: 2020-11-03 — End: 2021-01-03

## 2020-11-21 ENCOUNTER — Other Ambulatory Visit: Payer: Self-pay | Admitting: Family Medicine

## 2020-11-21 ENCOUNTER — Encounter: Payer: Self-pay | Admitting: Family Medicine

## 2020-11-23 ENCOUNTER — Telehealth: Payer: Self-pay

## 2020-11-23 NOTE — Telephone Encounter (Signed)
Pt printed letter via Deloris Ping.

## 2020-11-23 NOTE — Telephone Encounter (Signed)
When was his first day of symptoms?  He needs to be at least 10 days from symptom as well as have no fever and significant improvement in respiratory symptoms onset with they have 0 being first day of symptoms-on day 11 he would be cleared to travel and you can write him a letter

## 2020-11-23 NOTE — Telephone Encounter (Signed)
Pt called asking if Dr. Yong Channel could write a letter or fill out a form stating the pt has recovered from [place] for travel purposes. Pt tested positive on 11/17/2020 and is traveling on 11/28/2020. Please advise.

## 2020-11-23 NOTE — Telephone Encounter (Signed)
See below

## 2020-11-23 NOTE — Telephone Encounter (Signed)
Called and spoke with pt. Pt started having symptoms on 11/15/20 tested positive on 05/5. Gave pt below message and advised pt someone would call when letter available for pick up.

## 2021-01-03 ENCOUNTER — Other Ambulatory Visit: Payer: Self-pay | Admitting: Internal Medicine

## 2021-01-03 MED ORDER — OMEPRAZOLE 40 MG PO CPDR: 40.0000 mg | DELAYED_RELEASE_CAPSULE | Freq: Every day | ORAL | 3 refills | Status: DC

## 2021-01-04 ENCOUNTER — Telehealth: Payer: Self-pay

## 2021-01-04 NOTE — Telephone Encounter (Signed)
BC/BS form has been filled out and faxed to fax # [phone] to try and get the omeprazole 40mg  covered. Dx; [id number]. Will await outcome.

## 2021-01-05 NOTE — Telephone Encounter (Signed)
I received a fax of denial for the omeprazole 40mg  capsules. They do not cover any drug that has an over the counter drug equivalent. I have called Francis [name] and left him a detailed voicemail with this information and told him to call his insurance and see what do they cover. Let Francis [name] know and we can get Francis [name] to advise.

## 2021-03-10 ENCOUNTER — Ambulatory Visit: Payer: BC Managed Care – PPO | Admitting: Internal Medicine

## 2021-03-10 ENCOUNTER — Encounter: Payer: Self-pay | Admitting: Internal Medicine

## 2021-03-10 ENCOUNTER — Telehealth: Payer: Self-pay | Admitting: Internal Medicine

## 2021-03-10 VITALS — BP 120/80 | HR 87 | Ht 73.0 in | Wt 237.0 lb

## 2021-03-10 DIAGNOSIS — K2100 Gastro-esophageal reflux disease with esophagitis, without bleeding: Secondary | ICD-10-CM | POA: Diagnosis not present

## 2021-03-10 DIAGNOSIS — K224 Dyskinesia of esophagus: Secondary | ICD-10-CM

## 2021-03-10 MED ORDER — PANTOPRAZOLE SODIUM 40 MG PO TBEC
40.0000 mg | DELAYED_RELEASE_TABLET | Freq: Two times a day (BID) | ORAL | 3 refills | Status: DC
Start: 2021-03-10 — End: 2021-11-07

## 2021-03-10 NOTE — Progress Notes (Signed)
Francis [name] 35 y.o. 1985-11-10 [id number]  Assessment & Plan:   Encounter Diagnoses  Name Primary?   Gastroesophageal reflux disease with esophagitis without hemorrhage Yes   Ineffective esophageal motility     We will treat reflux with twice daily pantoprazole PPI since Dexilant is off formulary.  Reviewed manometry with Dr. Silverio Decamp.  The patient does not have significant reserve with esophageal motility so not a good candidate for even a loose wrap most likely.  It is a moot point due to patient's financial concerns at this time about healthcare expenditures and he actually is doing well at this time without heartburn problems for the most part and he does not have dysphagia.  Hopefully will get to that.  So we will maximize medical therapy and I will see him in a year sooner if needed.  Meds ordered this encounter  Medications   pantoprazole (PROTONIX) 40 MG tablet    Sig: Take 1 tablet (40 mg total) by mouth 2 (two) times daily before a meal.    Dispense:  180 tablet    Refill:  3   I did suggest the possibility of Dexilant through [place] but that would be cost prohibitive still.  The pantoprazole is supposed to cost $18 a prescription.  I appreciate the opportunity to care for this patient. CC: Marin Olp, MD  Subjective:   Chief Complaint: GERD  HPI Francis presents for follow-up, I saw him last year he was doing great on Dexilant 60 mg daily but that is not of his formulary so he has been taking 20 mg OTC omeprazole 2 at a time in the mornings and sometimes 20 at night with decent control of his heartburn symptoms he says.  Overall no dysphagia he still cannot burp.  He was seen in [place] last year and they wanted to do a pH impedance study to consider the possibility of a loose wrap it depending upon what was seen.  He was advised to use some Gaviscon at bedtime as needed.  He did not do that he did not see the utility in it plus there are cost  concerns. Allergies  Allergen Reactions   Amoxicillin    Penicillins    Current Meds  Medication Sig   pantoprazole (PROTONIX) 40 MG tablet Take 1 tablet (40 mg total) by mouth 2 (two) times daily before a meal.   [DISCONTINUED] omeprazole (PRILOSEC) 40 MG capsule Take 1 capsule (40 mg total) by mouth daily before breakfast. Approximately 30 minutes before   Past Medical History:  Diagnosis Date   Esophagitis, erosive 07/28/2009   Fundic gland polyps of stomach, benign    GERD (gastroesophageal reflux disease)    Past Surgical History:  Procedure Laterality Date   ESOPHAGEAL MANOMETRY N/A 06/19/2017   Procedure: ESOPHAGEAL MANOMETRY (EM);  Surgeon: Gatha Mayer, MD;  Location: [facility];  Service: Endoscopy;  Laterality: N/A;   ESOPHAGOGASTRODUODENOSCOPY     TONSILLECTOMY     young age   Social History   Social History Narrative   Married   1 daughter born 2020       [employer]   2 year degree at [employer]: traveling/photography, outdoor activity.       1 caffeine/day   Former smoker   1 EtOH/day   family history includes CVA in his maternal grandmother; Healthy in his mother; Heart disease in his paternal grandfather; Hypertension in his father; Lung cancer in  his maternal grandfather; Pancreatic cancer in his paternal aunt; Rheum arthritis in his father.   Review of Systems As above  Objective:   Physical Exam BP 120/80   Pulse 87   Ht '6\' 1"'$  (1.854 m)   Wt 237 lb (107.5 kg)   BMI 31.27 kg/m  21 minutes total time spent before during and after the visit on the work required

## 2021-03-10 NOTE — Telephone Encounter (Signed)
Pt called to inform that Pantoprazole needs PA.

## 2021-03-10 NOTE — Patient Instructions (Addendum)
If you are age 34 or older, your body mass index should be between 23-30. Your Body mass index is 31.27 kg/m. If this is out of the aforementioned range listed, please consider follow up with your Primary Care Provider.  If you are age 68 or younger, your body mass index should be between 19-25. Your Body mass index is 31.27 kg/m. If this is out of the aformentioned range listed, please consider follow up with your Primary Care Provider.   __________________________________________________________  We have sent the following medications to your pharmacy for you to pick up at your convenience: Pantoprazole   The [facility] providers would like to encourage you to use [facility] to communicate with providers for non-urgent requests or questions.  Due to long hold times on the telephone, sending your provider a message by [facility] may be a faster and more efficient way to get a response.  Please allow 48 business hours for a response.  Please remember that this is for non-urgent requests.   Follow up in 1 year or sooner if needed.  Thank you for choosing me and [facility].  Gatha Mayer, M.D., [facility]

## 2021-03-10 NOTE — Telephone Encounter (Signed)
I have started a prior authorization for Francis [name]'s pantoprazole '40mg'$  tablets thru [employer], dx: K21.9. Will await the outcome.

## 2021-03-13 NOTE — Telephone Encounter (Signed)
The pantoprazole has been approved thru 03/09/2022. [facility] informed. And sent [place] a [facility] message.

## 2021-05-23 DIAGNOSIS — H5711 Ocular pain, right eye: Secondary | ICD-10-CM | POA: Diagnosis not present

## 2021-09-15 NOTE — Progress Notes (Incomplete)
Phone: [phone]    Subjective:  Patient presents today for their annual physical. Chief complaint-noted.   See problem oriented charting- ROS- full  review of systems was completed and negative  except for: ***  The following were reviewed and entered/updated in epic: Past Medical History:  Diagnosis Date   Esophagitis, erosive 07/28/2009   Fundic gland polyps of stomach, benign    GERD (gastroesophageal reflux disease)    Patient Active Problem List   Diagnosis Date Noted   Ineffective esophageal motility 03/10/2021   Elevated blood pressure reading 08/13/2016   Right testicular pain 08/13/2016   Injury of right index finger 08/13/2016   GASTROESOPHAGEAL REFLUX DISEASE 07/27/2009   ABDOMINAL BLOATING 07/27/2009   Abnormal abdominal x-ray 07/27/2009   Past Surgical History:  Procedure Laterality Date   ESOPHAGEAL MANOMETRY N/A 06/19/2017   Procedure: ESOPHAGEAL MANOMETRY (EM);  Surgeon: Gatha Mayer, MD;  Location: [facility];  Service: Endoscopy;  Laterality: N/A;   ESOPHAGOGASTRODUODENOSCOPY     TONSILLECTOMY     young age    Family History  Problem Relation Age of Onset   Healthy Mother    Hypertension Father    Rheum arthritis Father    Lung cancer Maternal Grandfather    Heart disease Paternal Grandfather    CVA Maternal Grandmother    Pancreatic cancer Paternal Aunt    Colon cancer Neg Hx    Esophageal cancer Neg Hx    Rectal cancer Neg Hx    Stomach cancer Neg Hx     Medications- reviewed and updated Current Outpatient Medications  Medication Sig Dispense Refill   pantoprazole (PROTONIX) 40 MG tablet Take 1 tablet (40 mg total) by mouth 2 (two) times daily before a meal. 180 tablet 3   No current facility-administered medications for this visit.    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   2 year degree at [employer]: traveling/photography, outdoor activity.       1 caffeine/day   Former smoker   1 EtOH/day      Objective:  There were no vitals taken for this visit. Gen: NAD, resting comfortably HEENT: Mucous membranes are moist. Oropharynx normal Neck: no thyromegaly CV: RRR no murmurs rubs or gallops Lungs: CTAB no crackles, wheeze, rhonchi Abdomen: soft/nontender/nondistended/normal bowel sounds. No rebound or guarding.  Ext: no edema Skin: warm, dry Neuro: grossly normal, moves all extremities, PERRLA ***    Assessment and Plan:  36 y.o. male presenting for annual physical.  Health Maintenance counseling: 1. Anticipatory guidance: Patient counseled regarding regular dental exams ***q6 months, eye exams ***,  avoiding smoking and second hand smoke*** , limiting alcohol to 2 beverages per day***, no illicit drugs***.   2. Risk factor reduction:  Advised patient of need for regular exercise and diet rich and fruits and vegetables to reduce risk of heart attack and stroke.  Exercise- ***.  Diet/weight management-***.  Wt Readings from Last 3 Encounters:  03/10/21 237 lb (107.5 kg)  07/07/20 225 lb (102.1 kg)  12/24/19 225 lb (102.1 kg)   3. Immunizations/screenings/ancillary studies DISCUSSED:  -COVID booster vaccination #3- *** -Hepatitis C Screening - *** Immunization History  Administered Date(s) Administered   PFIZER(Purple Top)SARS-COV-2 Vaccination 09/17/2019, 10/20/2019   PPD Test 01/05/2014  Tdap 07/22/2017   Health Maintenance Due  Topic Date Due   Hepatitis C Screening  Never done   COVID-19 Vaccine (3 - Booster for Pfizer series) 12/15/2019    Family History  Problem Relation Age of Onset   Healthy Mother    Hypertension Father    Rheum arthritis Father    Lung cancer Maternal Grandfather    Heart disease Paternal Grandfather    CVA Maternal Grandmother    Pancreatic cancer Paternal Aunt    Colon  cancer Neg Hx    Esophageal cancer Neg Hx    Rectal cancer Neg Hx    Stomach cancer Neg Hx    4. Prostate cancer screening- *** No results found for: PSA 5. Colon cancer screening - *** 6. Skin cancer screening/prevention- ***advised regular sunscreen use. Denies worrisome, changing, or new skin lesions.  7. Testicular cancer screening- advised monthly self exams *** 8. STD screening- patient opts *** 9. Smoking associated screening- *** smoker- ***  Status of chronic or acute concerns   ***Office - let him know stomach polyps are benign and no follow-up needed   AND - refer to [facility] for esophageal and swallowing disorders (Drs Starla Link, Aroro or Eluri)   Reason is GERD and ineffective esophageal motility - evaluate for additional work-up and treatment  Recommended follow up: No follow-ups on file. Future Appointments  Date Time Provider [place]  09/27/2021  3:40 PM Marin Olp, MD [facility]    No chief complaint on file.  Lab/Order associations:*** fasting No diagnosis found.  No orders of the defined types were placed in this encounter.   I,Jada Bradford,acting as a scribe for Garret Reddish, MD.,have documented all relevant documentation on the behalf of Garret Reddish, MD,as directed by  Garret Reddish, MD while in the presence of Garret Reddish, MD.  *** Return precautions advised.   Burnett Corrente

## 2021-09-27 ENCOUNTER — Ambulatory Visit (INDEPENDENT_AMBULATORY_CARE_PROVIDER_SITE_OTHER): Payer: BC Managed Care – PPO | Admitting: Family Medicine

## 2021-09-27 ENCOUNTER — Encounter: Payer: Self-pay | Admitting: Family Medicine

## 2021-09-27 VITALS — BP 128/70 | HR 68 | Temp 97.9°F | Ht 73.0 in | Wt 238.0 lb

## 2021-09-27 DIAGNOSIS — Z1159 Encounter for screening for other viral diseases: Secondary | ICD-10-CM

## 2021-09-27 DIAGNOSIS — Z13220 Encounter for screening for lipoid disorders: Secondary | ICD-10-CM

## 2021-09-27 DIAGNOSIS — Z87891 Personal history of nicotine dependence: Secondary | ICD-10-CM | POA: Diagnosis not present

## 2021-09-27 DIAGNOSIS — K2100 Gastro-esophageal reflux disease with esophagitis, without bleeding: Secondary | ICD-10-CM | POA: Diagnosis not present

## 2021-09-27 DIAGNOSIS — Z79899 Other long term (current) drug therapy: Secondary | ICD-10-CM | POA: Diagnosis not present

## 2021-09-27 DIAGNOSIS — Z0000 Encounter for general adult medical examination without abnormal findings: Secondary | ICD-10-CM | POA: Diagnosis not present

## 2021-09-27 LAB — POC URINALSYSI DIPSTICK (AUTOMATED)
Bilirubin, UA: NEGATIVE
Blood, UA: NEGATIVE
Glucose, UA: NEGATIVE
Ketones, UA: NEGATIVE
Leukocytes, UA: NEGATIVE
Nitrite, UA: NEGATIVE
Protein, UA: NEGATIVE
Spec Grav, UA: 1.025 (ref 1.010–1.025)
Urobilinogen, UA: 0.2 E.U./dL
pH, UA: 6 (ref 5.0–8.0)

## 2021-09-27 NOTE — Patient Instructions (Addendum)
Please stop by lab before you go ?If you have mychart- we will send your results within 3 business days of [place] receiving them.  ?If you do not have mychart- we will call you about results within 5 business days of [place] receiving them.  ?*please also note that you will see labs on mychart as soon as they post. I will later go in and write notes on them- will say "notes from Dr. Yong Channel"  ? ?Myfitnesspal is an option if you find yourself struggling with weight loss. Over the next year lets work on minimum 10 lbs off.  ? ?Recommended follow up: Return in about 1 year (around 09/28/2022) for physical or sooner if needed. ?

## 2021-09-27 NOTE — Addendum Note (Signed)
Addended by: Marin Olp on: 09/27/2021 05:56 PM ? ? Modules accepted: Level of Service ? ?

## 2021-09-28 LAB — LIPID PANEL
Cholesterol: 174 mg/dL (ref 0–200)
HDL: 37.3 mg/dL — ABNORMAL LOW (ref >39.00)
LDL Cholesterol: 99 mg/dL (ref 0–99)
NonHDL: 136.86
Total CHOL/HDL Ratio: 5
Triglycerides: 187 mg/dL — ABNORMAL HIGH (ref 0.0–149.0)
VLDL: 37.4 mg/dL (ref 0.0–40.0)

## 2021-09-28 LAB — CBC WITH DIFFERENTIAL/PLATELET
Basophils Absolute: 0 10*3/uL (ref 0.0–0.1)
Basophils Relative: 0.4 % (ref 0.0–3.0)
Eosinophils Absolute: 0.2 10*3/uL (ref 0.0–0.7)
Eosinophils Relative: 1.9 % (ref 0.0–5.0)
HCT: 45.4 % (ref 39.0–52.0)
Hemoglobin: 15.6 g/dL (ref 13.0–17.0)
Lymphocytes Relative: 29.8 % (ref 12.0–46.0)
Lymphs Abs: 3 10*3/uL (ref 0.7–4.0)
MCHC: 34.4 g/dL (ref 30.0–36.0)
MCV: 87.3 fl (ref 78.0–100.0)
Monocytes Absolute: 0.6 10*3/uL (ref 0.1–1.0)
Monocytes Relative: 5.8 % (ref 3.0–12.0)
Neutro Abs: 6.3 10*3/uL (ref 1.4–7.7)
Neutrophils Relative %: 62.1 % (ref 43.0–77.0)
Platelets: 273 10*3/uL (ref 150.0–400.0)
RBC: 5.2 Mil/uL (ref 4.22–5.81)
RDW: 12.8 % (ref 11.5–15.5)
WBC: 10.1 10*3/uL (ref 4.0–10.5)

## 2021-09-28 LAB — COMPREHENSIVE METABOLIC PANEL
ALT: 26 U/L (ref 0–53)
AST: 22 U/L (ref 0–37)
Albumin: 5.1 g/dL (ref 3.5–5.2)
Alkaline Phosphatase: 58 U/L (ref 39–117)
BUN: 14 mg/dL (ref 6–23)
CO2: 24 mEq/L (ref 19–32)
Calcium: 9.9 mg/dL (ref 8.4–10.5)
Chloride: 103 mEq/L (ref 96–112)
Creatinine, Ser: 1 mg/dL (ref 0.40–1.50)
GFR: 98.13 mL/min (ref >60.00)
Glucose, Bld: 87 mg/dL (ref 70–99)
Potassium: 3.7 mEq/L (ref 3.5–5.1)
Sodium: 139 mEq/L (ref 135–145)
Total Bilirubin: 0.6 mg/dL (ref 0.2–1.2)
Total Protein: 7.7 g/dL (ref 6.0–8.3)

## 2021-09-28 LAB — VITAMIN B12: Vitamin B-12: 379 pg/mL (ref 211–911)

## 2021-09-28 LAB — HEPATITIS C ANTIBODY
Hepatitis C Ab: NONREACTIVE
SIGNAL TO CUT-OFF: 0.08 (ref <1.00)

## 2021-10-04 DIAGNOSIS — L821 Other seborrheic keratosis: Secondary | ICD-10-CM | POA: Diagnosis not present

## 2021-10-04 DIAGNOSIS — D225 Melanocytic nevi of trunk: Secondary | ICD-10-CM | POA: Diagnosis not present

## 2021-10-04 DIAGNOSIS — D2262 Melanocytic nevi of left upper limb, including shoulder: Secondary | ICD-10-CM | POA: Diagnosis not present

## 2021-10-04 DIAGNOSIS — D2271 Melanocytic nevi of right lower limb, including hip: Secondary | ICD-10-CM | POA: Diagnosis not present

## 2021-11-07 ENCOUNTER — Other Ambulatory Visit: Payer: Self-pay | Admitting: Internal Medicine

## 2022-08-29 ENCOUNTER — Encounter: Payer: Self-pay | Admitting: Family Medicine

## 2022-08-29 ENCOUNTER — Ambulatory Visit: Payer: BC Managed Care – PPO | Admitting: Family Medicine

## 2022-08-29 VITALS — BP 130/78 | HR 95 | Temp 97.7°F | Ht 73.0 in | Wt 240.0 lb

## 2022-08-29 DIAGNOSIS — N50811 Right testicular pain: Secondary | ICD-10-CM

## 2022-08-29 NOTE — Patient Instructions (Signed)
It was very nice to see you today!  I think you probably have a hernia.  We will check an ultrasound to confirm this.  We will refer you to see the surgeon depending on results of your ultrasound.  Take care, Dr Jerline Pain  PLEASE NOTE:  If you had any lab tests, please let us know if you have not heard back within a few days. You may see your results on mychart before we have a chance to review them but we will give you a call once they are reviewed by [place].   If we ordered any referrals today, please let us know if you have not heard from their office within the next week.   If you had any urgent prescriptions sent in today, please check with the pharmacy within an hour of our visit to make sure the prescription was transmitted appropriately.   Please try these tips to maintain a healthy lifestyle:  Eat at least 3 REAL meals and 1-2 snacks per day.  Aim for no more than 5 hours between eating.  If you eat breakfast, please do so within one hour of getting up.   Each meal should contain half fruits/vegetables, one quarter protein, and one quarter carbs (no bigger than a computer mouse)  Cut down on sweet beverages. This includes juice, soda, and sweet tea.   Drink at least 1 glass of water with each meal and aim for at least 8 glasses per day  Exercise at least 150 minutes every week.

## 2022-08-29 NOTE — Progress Notes (Signed)
   Francis [name] is a 37 y.o. male who presents today for an office visit.  Assessment/Plan:  Right testicular pain Concern for possible inguinal hernia based on exam.  We will check ultrasound to rule this out.  Will also check scrotal ultrasound to evaluate for other possible causes.  He can continue using over-the-counter meds as needed.  We discussed reasons return to care and seek emergent care including warning signs for incarcerated hernia.  Would consider referral to general surgery versus urology depending on results of his ultrasound.    Subjective:  HPI:  Patient here with testicular pain. Started about a week ago. Hurts more when up and walking around. No bumps or lumps.  He had something similar about 5 years ago. He did have an ultrasound about 8 years ago. No injuries or obvious precipitating events. Better with ibuprofen. No nausea or vomiting. Worse with bowel movements.        Objective:  Physical Exam: BP 130/78   Pulse 95   Temp 97.7 F (36.5 C) (Temporal)   Ht '6\' 1"'$  (1.854 m)   Wt 240 lb (108.9 kg)   SpO2 96%   BMI 31.66 kg/m   Gen: No acute distress, resting comfortably CV: Regular rate and rhythm with no murmurs appreciated Pulm: Normal work of breathing, clear to auscultation bilaterally with no crackles, wheezes, or rhonchi GU: Normal male genitalia.  Testicles palpated without any appreciable lumps or masses.  Nontender.  Bulge noted in right inguinal canals with Valsalva. Neuro: Grossly normal, moves all extremities Psych: Normal affect and thought content      Francis [name] M. Jerline Pain, MD 08/29/2022 8:52 AM

## 2022-09-05 ENCOUNTER — Ambulatory Visit
Admission: RE | Admit: 2022-09-05 | Discharge: 2022-09-05 | Disposition: A | Discharge status: Home / Self Care | Payer: BC Managed Care – PPO | Source: Ambulatory Visit | Attending: Family Medicine | Admitting: Family Medicine

## 2022-09-05 ENCOUNTER — Other Ambulatory Visit: Payer: BC Managed Care – PPO

## 2022-09-05 DIAGNOSIS — N50811 Right testicular pain: Secondary | ICD-10-CM

## 2022-09-05 DIAGNOSIS — N503 Cyst of epididymis: Secondary | ICD-10-CM | POA: Diagnosis not present

## 2022-09-05 DIAGNOSIS — N433 Hydrocele, unspecified: Secondary | ICD-10-CM | POA: Diagnosis not present

## 2022-09-05 DIAGNOSIS — R1030 Lower abdominal pain, unspecified: Secondary | ICD-10-CM | POA: Diagnosis not present

## 2022-09-06 ENCOUNTER — Telehealth: Payer: Self-pay | Admitting: Family Medicine

## 2022-09-06 NOTE — Telephone Encounter (Signed)
Patient states he got results from imaging and wanted to discuss this with provider. Requests a callback.

## 2022-09-06 NOTE — Progress Notes (Signed)
Please inform patient of the following:  His ultrasound does not show any signs of a hernia however he did have a small hydrocele on his right testicle.  This could explain some of his pain.  Recommend referral to urology if he is still having issues.

## 2022-09-07 ENCOUNTER — Other Ambulatory Visit: Payer: Self-pay

## 2022-09-07 DIAGNOSIS — N433 Hydrocele, unspecified: Secondary | ICD-10-CM

## 2022-09-07 NOTE — Telephone Encounter (Signed)
No home remedies to treat. They are common and can occur over long periods of time but there is no specific cause for them. It is probably due to an imbalance of fluid resorption in his scrotum.   He can schedule an appointment here or he can follow up with urology if he has further questions.  Francis [name]. Jerline Pain, MD 09/07/2022 8:26 AM

## 2022-09-07 NOTE — Telephone Encounter (Signed)
Patient is wanting to know what causes hydrocele's and if there are any home remedies to treat it. Please advise

## 2022-09-07 NOTE — Telephone Encounter (Signed)
Patient need OV with PCP

## 2022-09-10 ENCOUNTER — Ambulatory Visit: Payer: BC Managed Care – PPO | Admitting: Family Medicine

## 2022-09-10 ENCOUNTER — Encounter: Payer: Self-pay | Admitting: Family Medicine

## 2022-09-10 VITALS — BP 134/86 | HR 93 | Temp 98.2°F | Ht 73.0 in | Wt 240.0 lb

## 2022-09-10 DIAGNOSIS — N433 Hydrocele, unspecified: Secondary | ICD-10-CM | POA: Diagnosis not present

## 2022-09-10 DIAGNOSIS — L989 Disorder of the skin and subcutaneous tissue, unspecified: Secondary | ICD-10-CM | POA: Diagnosis not present

## 2022-09-10 NOTE — Progress Notes (Signed)
   Francis [name] is a 37 y.o. male who presents today for an office visit.  Assessment/Plan:  Hydrocele / Testicular Pain Patient with small hydrocele on recent ultrasound likely explains his testicular pain.  It is reassuring that his pain is resolved at this point.  We did discuss urology referral and follow-up however given that symptoms have resolved we will continue with watchful waiting for now.  He has had intermittent issues with this in the past.  If continues to have ongoing issues he is agreeable to see urology at that point.  He can use over-the-counter meds as needed in the interim for mild pain.  We discussed reasons to return to care.  Skin Lesion Located on left posterior scalp.  Consistent with benign nevus.  He has dermatology follow-up planned.    Subjective:  HPI:  Patient is here for follow up. We last saw him last week for testicular pain.  There was concern for possible hernia.  We checked ultrasound to further evaluate.  This was negative for hernia however did show hydrocele.  His symptoms have resolved since our last visit though he does note he still gets intermittent issues with testicular pain for the past several years.   He also noticed a skin lump on left posterior scalp just behind the ear.  Mildly painful with palpation.       Objective:  Physical Exam: BP 134/86   Pulse 93   Temp 98.2 F (36.8 C) (Temporal)   Ht 6' 1"$  (1.854 m)   Wt 240 lb (108.9 kg)   SpO2 98%   BMI 31.66 kg/m   Gen: No acute distress, resting comfortably Neuro: Grossly normal, moves all extremities Skin: Benign-appearing nevus posterior to left auricle.  Psych: Normal affect and thought content      Natalie Leclaire M. Jerline Pain, MD 09/10/2022 2:21 PM

## 2022-09-10 NOTE — Patient Instructions (Signed)
It was very nice to see you today!  Your hydrocele probably is what was causing your discomfort.  Please let us know if your pain returns.  Take care, Dr Jerline Pain  PLEASE NOTE:  If you had any lab tests, please let us know if you have not heard back within a few days. You may see your results on mychart before we have a chance to review them but we will give you a call once they are reviewed by [place].   If we ordered any referrals today, please let us know if you have not heard from their office within the next week.   If you had any urgent prescriptions sent in today, please check with the pharmacy within an hour of our visit to make sure the prescription was transmitted appropriately.   Please try these tips to maintain a healthy lifestyle:  Eat at least 3 REAL meals and 1-2 snacks per day.  Aim for no more than 5 hours between eating.  If you eat breakfast, please do so within one hour of getting up.   Each meal should contain half fruits/vegetables, one quarter protein, and one quarter carbs (no bigger than a computer mouse)  Cut down on sweet beverages. This includes juice, soda, and sweet tea.   Drink at least 1 glass of water with each meal and aim for at least 8 glasses per day  Exercise at least 150 minutes every week.

## 2022-10-01 ENCOUNTER — Encounter: Payer: BC Managed Care – PPO | Admitting: Family Medicine

## 2022-11-26 DIAGNOSIS — D2262 Melanocytic nevi of left upper limb, including shoulder: Secondary | ICD-10-CM | POA: Diagnosis not present

## 2022-11-26 DIAGNOSIS — D2261 Melanocytic nevi of right upper limb, including shoulder: Secondary | ICD-10-CM | POA: Diagnosis not present

## 2022-11-26 DIAGNOSIS — D225 Melanocytic nevi of trunk: Secondary | ICD-10-CM | POA: Diagnosis not present

## 2022-11-26 DIAGNOSIS — L821 Other seborrheic keratosis: Secondary | ICD-10-CM | POA: Diagnosis not present

## 2023-01-29 ENCOUNTER — Other Ambulatory Visit: Payer: Self-pay | Admitting: Internal Medicine

## 2023-02-04 ENCOUNTER — Other Ambulatory Visit: Payer: Self-pay | Admitting: Internal Medicine

## 2023-02-11 ENCOUNTER — Encounter: Payer: BC Managed Care – PPO | Admitting: Family Medicine

## 2023-04-30 ENCOUNTER — Other Ambulatory Visit: Payer: Self-pay | Admitting: Internal Medicine

## 2023-07-25 ENCOUNTER — Other Ambulatory Visit: Payer: Self-pay | Admitting: Internal Medicine

## 2023-08-26 ENCOUNTER — Ambulatory Visit: Payer: BC Managed Care – PPO | Admitting: Family Medicine

## 2023-08-30 ENCOUNTER — Encounter: Payer: Self-pay | Admitting: Family Medicine

## 2023-08-30 ENCOUNTER — Ambulatory Visit (INDEPENDENT_AMBULATORY_CARE_PROVIDER_SITE_OTHER): Payer: BC Managed Care – PPO | Admitting: Family Medicine

## 2023-08-30 VITALS — BP 117/79 | HR 90 | Temp 97.9°F | Ht 73.0 in | Wt 231.4 lb

## 2023-08-30 DIAGNOSIS — Z13220 Encounter for screening for lipoid disorders: Secondary | ICD-10-CM | POA: Diagnosis not present

## 2023-08-30 DIAGNOSIS — Z125 Encounter for screening for malignant neoplasm of prostate: Secondary | ICD-10-CM

## 2023-08-30 DIAGNOSIS — Z0000 Encounter for general adult medical examination without abnormal findings: Secondary | ICD-10-CM

## 2023-08-30 DIAGNOSIS — E669 Obesity, unspecified: Secondary | ICD-10-CM

## 2023-08-30 DIAGNOSIS — K2100 Gastro-esophageal reflux disease with esophagitis, without bleeding: Secondary | ICD-10-CM

## 2023-08-30 DIAGNOSIS — Z79899 Other long term (current) drug therapy: Secondary | ICD-10-CM | POA: Diagnosis not present

## 2023-08-30 LAB — LIPID PANEL
Cholesterol: 126 mg/dL (ref 0–200)
HDL: 22.2 mg/dL — ABNORMAL LOW (ref >39.00)
LDL Cholesterol: 75 mg/dL (ref 0–99)
NonHDL: 103.64
Total CHOL/HDL Ratio: 6
Triglycerides: 141 mg/dL (ref 0.0–149.0)
VLDL: 28.2 mg/dL (ref 0.0–40.0)

## 2023-08-30 LAB — CBC WITH DIFFERENTIAL/PLATELET
Basophils Absolute: 0 10*3/uL (ref 0.0–0.1)
Basophils Relative: 0.3 % (ref 0.0–3.0)
Eosinophils Absolute: 0.1 10*3/uL (ref 0.0–0.7)
Eosinophils Relative: 1.3 % (ref 0.0–5.0)
HCT: 44.1 % (ref 39.0–52.0)
Hemoglobin: 15.3 g/dL (ref 13.0–17.0)
Lymphocytes Relative: 31.2 % (ref 12.0–46.0)
Lymphs Abs: 1.9 10*3/uL (ref 0.7–4.0)
MCHC: 34.6 g/dL (ref 30.0–36.0)
MCV: 86.7 fL (ref 78.0–100.0)
Monocytes Absolute: 0.5 10*3/uL (ref 0.1–1.0)
Monocytes Relative: 7.6 % (ref 3.0–12.0)
Neutro Abs: 3.6 10*3/uL (ref 1.4–7.7)
Neutrophils Relative %: 59.6 % (ref 43.0–77.0)
Platelets: 336 10*3/uL (ref 150.0–400.0)
RBC: 5.09 Mil/uL (ref 4.22–5.81)
RDW: 12.7 % (ref 11.5–15.5)
WBC: 6.1 10*3/uL (ref 4.0–10.5)

## 2023-08-30 LAB — COMPREHENSIVE METABOLIC PANEL
ALT: 20 U/L (ref 0–53)
AST: 18 U/L (ref 0–37)
Albumin: 4.7 g/dL (ref 3.5–5.2)
Alkaline Phosphatase: 47 U/L (ref 39–117)
BUN: 16 mg/dL (ref 6–23)
CO2: 26 meq/L (ref 19–32)
Calcium: 9.2 mg/dL (ref 8.4–10.5)
Chloride: 105 meq/L (ref 96–112)
Creatinine, Ser: 0.92 mg/dL (ref 0.40–1.50)
GFR: 107 mL/min (ref >60.00)
Glucose, Bld: 92 mg/dL (ref 70–99)
Potassium: 3.9 meq/L (ref 3.5–5.1)
Sodium: 138 meq/L (ref 135–145)
Total Bilirubin: 0.8 mg/dL (ref 0.2–1.2)
Total Protein: 7.4 g/dL (ref 6.0–8.3)

## 2023-08-30 LAB — HEMOGLOBIN A1C: Hgb A1c MFr Bld: 5.4 % (ref 4.6–6.5)

## 2023-08-30 LAB — VITAMIN B12: Vitamin B-12: 465 pg/mL (ref 211–911)

## 2023-08-30 NOTE — Patient Instructions (Addendum)
Please stop by lab before you go If you have mychart- we will send your results within 3 business days of [place] receiving them.  If you do not have mychart- we will call you about results within 5 business days of [place] receiving them.  *please also note that you will see labs on mychart as soon as they post. I will later go in and write notes on them- will say "notes from Dr. Durene Cal"   Only things from today I can see before labs- try to increase intensity or duration of exercise and continue to work on gradual weight loss- glad you are doing weight watchers  Recommended follow up: Return in about 1 year (around 08/29/2024) for physical or sooner if needed.Schedule b4 you leave.

## 2023-08-30 NOTE — Progress Notes (Signed)
Phone: [phone]    Subjective:  Patient presents today for their annual physical. Chief complaint-noted.   See problem oriented charting- ROS- full  review of systems was completed and negative  except for: Cough/congestion issues after reported influenza then had later teledoc visit with lingering symptoms about 2 weeks out and placed on azithromycin and helping- on day 3  The following were reviewed and entered/updated in epic: Past Medical History:  Diagnosis Date   Esophagitis, erosive 07/28/2009   Fundic gland polyps of stomach, benign    GERD (gastroesophageal reflux disease)    Patient Active Problem List   Diagnosis Date Noted   Elevated blood pressure reading 08/13/2016    Priority: Medium    Right testicular pain 08/13/2016    Priority: Medium    GASTROESOPHAGEAL REFLUX DISEASE 07/27/2009    Priority: Medium    Injury of right index finger 08/13/2016    Priority: Low   ABDOMINAL BLOATING 07/27/2009    Priority: Low   Abnormal abdominal x-ray 07/27/2009    Priority: Low   Ineffective esophageal motility 03/10/2021   Past Surgical History:  Procedure Laterality Date   ESOPHAGEAL MANOMETRY N/A 06/19/2017   Procedure: ESOPHAGEAL MANOMETRY (EM);  Surgeon: Iva Boop, MD;  Location: [facility];  Service: Endoscopy;  Laterality: N/A;   ESOPHAGOGASTRODUODENOSCOPY     TONSILLECTOMY     young age    Family History  Problem Relation Age of Onset   Healthy Mother    Hypertension Father    Rheum arthritis Father    Congestive Heart Failure Father        unclear trigger   CVA Maternal Grandmother    Lung cancer Maternal Grandfather    Heart disease Paternal Grandfather    Pancreatic cancer Paternal Aunt    Colon cancer Neg Hx    Esophageal cancer Neg Hx    Rectal cancer Neg Hx    Stomach cancer Neg Hx     Medications- reviewed and updated Current Outpatient Medications  Medication Sig Dispense Refill   azithromycin (ZITHROMAX) 250 MG tablet 250 mg  daily.     pantoprazole (PROTONIX) 40 MG tablet TAKE 1 TABLET(40 MG) BY MOUTH TWICE DAILY BEFORE A MEAL 180 tablet 0   No current facility-administered medications for this visit.    Allergies-reviewed and updated Allergies  Allergen Reactions   Amoxicillin    Penicillins     Social History   Social History Narrative   Married. 1 daughter born 39 . 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    2 year degree at [employer]: traveling/photography, outdoor activity.       1 caffeine/day   Former smoker   1 EtOH/day      Objective:  BP 117/79   Pulse 90   Temp 97.9 F (36.6 C)   Ht 6\' 1"  (1.854 m)   Wt 231 lb 6.4 oz (105 kg)   SpO2 99%   BMI 30.53 kg/m  Gen: NAD, resting comfortably HEENT: Mucous membranes are moist. Oropharynx normal Neck: no thyromegaly CV: RRR no murmurs rubs or gallops Lungs: CTAB no crackles, wheeze, rhonchi Abdomen: soft/nontender/nondistended/normal bowel sounds. No rebound or guarding.  Ext: no edema Skin: warm, dry Neuro: grossly normal, moves all extremities, PERRLA    Assessment and Plan:  38 y.o. male presenting for annual physical.  Health Maintenance counseling: 1. Anticipatory guidance: Patient  counseled regarding regular dental exams -q6 months, eye exams - yearly,  avoiding smoking and second hand smoke , limiting alcohol to 2 beverages per day- off for 3 weeks with illness- 1-2 a night max on weekends, no illicit drugs.   2. Risk factor reduction:  Advised patient of need for regular exercise and diet rich and fruits and vegetables to reduce risk of heart attack and stroke.  Exercise-enjoys the outdoors- has been hard with daughter being 4 and wants to get back to hiking/backpacking- trying to increase steps-8000 steps most days.  Diet/weight management-weight down 7 pounds from last physical.  He is a self-described foodie and this makes things  challenging. Some weight loss with flu and doing weight watchers. Wants to get to 210 Wt Readings from Last 3 Encounters:  08/30/23 231 lb 6.4 oz (105 kg)  09/10/22 240 lb (108.9 kg)  08/29/22 240 lb (108.9 kg)  3. Immunizations/screenings/ancillary studies-declines flu shot this year-recently had influenza, declines COVID shot Immunization History  Administered Date(s) Administered   PFIZER(Purple Top)SARS-COV-2 Vaccination 09/17/2019, 10/20/2019   PPD Test 01/05/2014   Tdap 07/22/2017  4. Prostate cancer screening-  no family history, start at age 75   5. Colon cancer screening - no family history, start at age 31  6. Skin cancer screening/prevention-sees Dr. Leonie Man with [facility] dermatology. advised regular sunscreen use. Denies worrisome, changing, or new skin lesions.  7. Testicular cancer screening- advised monthly self exams plus has hydrocele 8. STD screening- patient opts out as only active with wife 9. Smoking associated screening-former smoker- quit over 10 years ago and minimal pack years under 5-so no regular screening  Status of chronic or acute concerns   #social update- ski trip in march and in June 2 weeks in [place]!   # GERD-patient remains on Protonix 40 mg twice daily through Dr. Leone Payor and had considered fundoplication even but follow up led to dysmotility concern -Has been referred to [facility] for esophageal and swallowing disorders by Dr. Leone Payor for ineffective esophageal motility in the past but workup was costly and ultimately opted out -overall stable. Eating better lately has lately Lab Results  Component Value Date   VITAMINB12 379 09/27/2021   # Screening hyperlipidemia-update lipid panel-discussed working on healthy eating/regular exercise Lab Results  Component Value Date   CHOL 174 09/27/2021   HDL 37.30 (L) 09/27/2021   LDLCALC 99 09/27/2021   TRIG 187.0 (H) 09/27/2021   CHOLHDL 5 09/27/2021    # Allergies-Claritin daily helps with  allergies and some prior itchy rashes on trunk/arms/legs that spares face and palms-reports ended up just resolving  # Right testicular discomfort-found to have hydrocele rather small but symptoms resolved and he wanted to watchfully wait after visit last February -intermittent issues at this point and worse if having to strain  Recommended follow up: Return in about 1 year (around 08/29/2024) for physical or sooner if needed.Schedule b4 you leave.  Lab/Order associations: fasting   ICD-10-CM   1. Preventative health care  Z00.00     2. Screening for hyperlipidemia  Z13.220 Lipid panel    3. High risk medication use  Z79.899 Vitamin B12    4. Gastroesophageal reflux disease with esophagitis without hemorrhage  K21.00 Comprehensive metabolic panel    CBC with Differential/Platelet    5. Screening for prostate cancer  Z12.5 Hemoglobin A1c    6. Obesity (BMI 30-39.9)  E66.9 Hemoglobin A1c     No orders of the defined types were placed in this encounter.  Return precautions advised.  Tana Conch, MD

## 2023-10-18 ENCOUNTER — Other Ambulatory Visit: Payer: Self-pay | Admitting: Internal Medicine

## 2023-12-16 DIAGNOSIS — D2262 Melanocytic nevi of left upper limb, including shoulder: Secondary | ICD-10-CM | POA: Diagnosis not present

## 2023-12-16 DIAGNOSIS — D485 Neoplasm of uncertain behavior of skin: Secondary | ICD-10-CM | POA: Diagnosis not present

## 2023-12-16 DIAGNOSIS — D2272 Melanocytic nevi of left lower limb, including hip: Secondary | ICD-10-CM | POA: Diagnosis not present

## 2023-12-16 DIAGNOSIS — D2261 Melanocytic nevi of right upper limb, including shoulder: Secondary | ICD-10-CM | POA: Diagnosis not present

## 2023-12-16 DIAGNOSIS — D225 Melanocytic nevi of trunk: Secondary | ICD-10-CM | POA: Diagnosis not present

## 2023-12-25 ENCOUNTER — Encounter: Payer: Self-pay | Admitting: Gastroenterology

## 2023-12-25 ENCOUNTER — Ambulatory Visit: Admitting: Gastroenterology

## 2023-12-25 VITALS — BP 122/70 | HR 88 | Ht 73.0 in | Wt 235.1 lb

## 2023-12-25 DIAGNOSIS — R140 Abdominal distension (gaseous): Secondary | ICD-10-CM

## 2023-12-25 DIAGNOSIS — K219 Gastro-esophageal reflux disease without esophagitis: Secondary | ICD-10-CM | POA: Diagnosis not present

## 2023-12-25 MED ORDER — PANTOPRAZOLE SODIUM 40 MG PO TBEC: 40.0000 mg | DELAYED_RELEASE_TABLET | Freq: Two times a day (BID) | ORAL | 3 refills | Status: AC

## 2023-12-25 NOTE — Patient Instructions (Signed)
 We have sent the following medications to your pharmacy for you to pick up at your convenience: Pantoprazole  40 mg twice daily 30-60 minutes before breakfast and dinner.   IBgard samples for bloating and reflux.  _______________________________________________________  If your blood pressure at your visit was 140/90 or greater, please contact your primary care physician to follow up on this.  _______________________________________________________  If you are age 37 or older, your body mass index should be between 23-30. Your Body mass index is 31.02 kg/m. If this is out of the aforementioned range listed, please consider follow up with your Primary Care Provider.  If you are age 53 or younger, your body mass index should be between 19-25. Your Body mass index is 31.02 kg/m. If this is out of the aformentioned range listed, please consider follow up with your Primary Care Provider.   ________________________________________________________  The [facility] providers would like to encourage you to use MYCHART to communicate with providers for non-urgent requests or questions.  Due to long hold times on the telephone, sending your provider a message by [facility] may be a faster and more efficient way to get a response.  Please allow 48 business hours for a response.  Please remember that this is for non-urgent requests.  _______________________________________________________

## 2023-12-25 NOTE — Progress Notes (Signed)
 12/25/2023 Francis [name] [id number] 1985-08-10   HISTORY OF PRESENT ILLNESS: This is a 38 year old male is a patient of Dr. Jadene Maxwell.  He is here for follow-up of his GERD and refills on his pantoprazole .  He takes pantoprazole  40 mg twice daily.  He was last seen here almost 3 years ago.  He says that overall he is taking it regularly and it works well.  He still complains of not being able to burp.  He says that he researched it and found that facility in [place]  does injection of Botox into the LES to relax it and allow patients to burp.  Advised that that sounds like something that is research related and not something that we would perform here.  No dysphagia.  He does report generalized abdominal bloating, mostly on a daily basis and occurs throughout the day as he is eating.  When he wakes up in the morning does not feel bloated.  Says that when he is eating healthier it seems to be better.  Has tried Gas-X without much relief or improvement.  Says he moves his bowels well without any issues.   Past Medical History:  Diagnosis Date   Esophagitis, erosive 07/28/2009   Fundic gland polyps of stomach, benign    GERD (gastroesophageal reflux disease)    Past Surgical History:  Procedure Laterality Date   ESOPHAGEAL MANOMETRY N/A 06/19/2017   Procedure: ESOPHAGEAL MANOMETRY (EM);  Surgeon: Kenney Peacemaker, MD;  Location: [facility];  Service: Endoscopy;  Laterality: N/A;   ESOPHAGOGASTRODUODENOSCOPY     TONSILLECTOMY     young age    reports that he quit smoking about 13 years ago. His smoking use included cigarettes. He started smoking about 17 years ago. He has a 0.4 pack-year smoking history. He has never used smokeless tobacco. He reports current alcohol use of about 6.0 - 8.0 standard drinks of alcohol per week. He reports that he does not use drugs. family history includes CVA in his maternal grandmother; Congestive Heart Failure in his father; Healthy in his mother;  Heart disease in his paternal grandfather; Hypertension in his father; Lung cancer in his maternal grandfather; Pancreatic cancer in his paternal aunt; Rheum arthritis in his father. Allergies  Allergen Reactions   Amoxicillin    Penicillins       Outpatient Encounter Medications as of 12/25/2023  Medication Sig   pantoprazole  (PROTONIX ) 40 MG tablet TAKE 1 TABLET(40 MG) BY MOUTH TWICE DAILY BEFORE A MEAL   [DISCONTINUED] azithromycin  (ZITHROMAX ) 250 MG tablet 250 mg daily.   No facility-administered encounter medications on file as of 12/25/2023.    REVIEW OF SYSTEMS  : All other systems reviewed and negative except where noted in the History of Present Illness.   PHYSICAL EXAM: BP 122/70 (BP Location: Right Arm, Patient Position: Sitting, Cuff Size: Normal)   Pulse 88   Ht 6' 1 (1.854 m)   Wt 235 lb 2 oz (106.7 kg)   BMI 31.02 kg/m  General: Well developed white male in no acute distress Head: Normocephalic and atraumatic Eyes:  Sclerae anicteric, conjunctiva pink. Ears: Normal auditory acuity Lungs: Clear throughout to auscultation; no W/R/R. Heart: Regular rate and rhythm; no M/R/G. Musculoskeletal: Symmetrical with no gross deformities  Skin: No lesions on visible extremities Extremities: No edema  Neurological: Alert oriented x 4, grossly non-focal Psychological:  Alert and cooperative. Normal mood and affect  ASSESSMENT AND PLAN: *GERD: Well-controlled on pantoprazole  40 mg twice daily.  Will renew  prescription for a year.  Follow-up in 1 to 2 years or sooner if needed. *Bloating: Reports abdominal bloating throughout the day as he eats.  Seems to be better when he eats healthier, etc.  Likely dietary related.  Does not think Gas-X works.  Will try IBgard.  Samples given.   CC:  Almira Jaeger, MD

## 2024-01-13 ENCOUNTER — Encounter: Payer: Self-pay | Admitting: Family

## 2024-01-13 ENCOUNTER — Ambulatory Visit: Payer: Self-pay

## 2024-01-13 ENCOUNTER — Ambulatory Visit: Admitting: Family

## 2024-01-13 VITALS — BP 127/76 | HR 86 | Temp 97.5°F | Ht 73.0 in | Wt 235.0 lb

## 2024-01-13 DIAGNOSIS — T63463A Toxic effect of venom of wasps, assault, initial encounter: Secondary | ICD-10-CM

## 2024-01-13 DIAGNOSIS — L03119 Cellulitis of unspecified part of limb: Secondary | ICD-10-CM | POA: Diagnosis not present

## 2024-01-13 MED ORDER — DOXYCYCLINE HYCLATE 100 MG PO TABS: 100.0000 mg | ORAL_TABLET | Freq: Two times a day (BID) | ORAL | 0 refills | Status: AC

## 2024-01-13 NOTE — Telephone Encounter (Signed)
 Appt today

## 2024-01-13 NOTE — Progress Notes (Signed)
 Patient ID: Francis [name], male    DOB: January 02, 1986, 38 y.o.   MRN: [id number]  Chief Complaint  Patient presents with   Ankle Injury    Pt was cutting his green on last Friday and was bitten by a bee or yellow jacket , having swelling , has a redness , warm to touch, used after bite cream , having some numbness  Discussed the use of AI scribe software for clinical note transcription with the patient, who gave verbal consent to proceed.  History of Present Illness Francis [name] is a 38 year old male who presents with swelling and tightness in the ankle and foot following a suspected yellow jacket sting.  He experienced an immediate sting on his right ankle on Friday evening, leading to a small red dot at the site without blistering. The area quickly became swollen with redness and warmth, causing tightness and itching, which he manages by rubbing with his hand. He recalls a similar reaction as a teenager with significant swelling of the hand. The swelling persists despite icing, and walking or standing worsens it, causing difficulty in weight-bearing and a limp due to pain. He notes slight numbness, especially when driving, and tingling when clothing contacts the area. There are no fever, chills, or systemic symptoms. He has no history of anaphylactic reactions and has never used an EpiPen, but since this reaction is worse than last time he is concerned how allergic he may be.  Assessment & Plan Insect sting reaction Sting on right lateral ankle, likely from yellow jacket causing swelling, tightness, numbness. History of mild allergic reactions. Current reaction more severe. No anaphylaxis. Concern for cellulitis d/t erythema/warmth versus inflammatory reaction.  - Prescribe doxycycline for one week, twice daily, advised on possible SE, including sun sensitivity,  advise sunscreen and protective clothing. - Advise elevation of limb to reduce swelling. - Recommend ice  application for 15-20 minutes 3x/day to manage swelling. - Call office back if no improvement in sx after finishing abt  Allergy to insect stings Mild-moderate allergic reactions to yellow jacket stings. No anaphylaxis history. Concern for worsening reactions. Fear of severe reactions with future stings. - Refer to allergist for evaluation and consideration of EpiPen prescription.       Subjective:    Outpatient Medications Prior to Visit  Medication Sig Dispense Refill   pantoprazole  (PROTONIX ) 40 MG tablet Take 1 tablet (40 mg total) by mouth 2 (two) times daily. 180 tablet 3   No facility-administered medications prior to visit.   Past Medical History:  Diagnosis Date   Esophagitis, erosive 07/28/2009   Fundic gland polyps of stomach, benign    GERD (gastroesophageal reflux disease)    Past Surgical History:  Procedure Laterality Date   ESOPHAGEAL MANOMETRY N/A 06/19/2017   Procedure: ESOPHAGEAL MANOMETRY (EM);  Surgeon: Avram Lupita BRAVO, MD;  Location: [facility];  Service: Endoscopy;  Laterality: N/A;   ESOPHAGOGASTRODUODENOSCOPY     TONSILLECTOMY     young age   Allergies  Allergen Reactions   Amoxicillin    Penicillins       Objective:    Physical Exam Vitals and nursing note reviewed.  Constitutional:      General: He is not in acute distress.    Appearance: Normal appearance.  HENT:     Head: Normocephalic.   Cardiovascular:     Rate and Rhythm: Normal rate and regular rhythm.  Pulmonary:     Effort: Pulmonary effort is normal.  Breath sounds: Normal breath sounds.   Musculoskeletal:        General: Normal range of motion.     Cervical back: Normal range of motion.       Feet:   Skin:    General: Skin is warm and dry.     Findings: Rash (right lateral ankle, striated mild erythema & warmth in area of bite (see diagram) and moderate amount of swelling in ankle and foot) present.   Neurological:     Mental Status: He is alert and oriented to  person, place, and time.   Psychiatric:        Mood and Affect: Mood normal.    BP 127/76   Pulse 86   Temp (!) 97.5 F (36.4 C)   Ht 6' 1 (1.854 m)   Wt 235 lb (106.6 kg)   SpO2 98%   BMI 31.00 kg/m  Wt Readings from Last 3 Encounters:  01/13/24 235 lb (106.6 kg)  12/25/23 235 lb 2 oz (106.7 kg)  08/30/23 231 lb 6.4 oz (105 kg)       Lucius Krabbe, NP

## 2024-01-13 NOTE — Telephone Encounter (Signed)
 FYI Only or Action Required?: FYI only for provider.  Patient was last seen in primary care on 08/30/2023 by Katrinka Garnette KIDD, MD. Called Nurse Triage reporting Insect Bite. Symptoms began several days ago. Interventions attempted: Nothing. Symptoms are: Swollen red ankle - insect biteunchanged.  Triage Disposition: See HCP Within 4 Hours (Or PCP Triage)  Patient/caregiver understands and will follow disposition?: Yes              Copied from CRM [id number]. Topic: Clinical - Red Word Triage >> Jan 13, 2024  8:17 AM Antwanette L wrote: Red Word that prompted transfer to Nurse Triage: patient was stung by a bee on Friday. Right ankle is swollen and severe pain Reason for Disposition  [1] Red streak or red line AND [2] length > 2 inches (5 cm)  Answer Assessment - Initial Assessment Questions 1. TYPE: What type of sting was it? (bee, yellow jacket, etc.)      Yellow jacket 2. ONSET: When did it occur?      Friday evening 3. LOCATION: Where is the sting located?  How many stings?     Right ankle 4. SWELLING SIZE: How big is the swelling? (e.g., inches or cm)     Pretty bad 5. REDNESS: Is the area red or pink? If Yes, ask: What size is area of redness? (e.g., inches or cm). When did the redness start?     Redness - up leg and own to toes 6. PAIN: Is there any pain? If Yes, ask: How bad is it?  (Scale 1-10; or mild, moderate, severe)     Yes - severe 7. ITCHING: Is there any itching? If Yes, ask: How bad is it?      yes 8. RESPIRATORY DISTRESS: Describe your breathing.     no 9. PRIOR REACTIONS: Have you had any severe allergic reactions to stings in the past? if yes, ask: What happened?     yes 10. OTHER SYMPTOMS: Do you have any other symptoms? (e.g., abdomen pain, face or tongue swelling, new rash elsewhere, vomiting)       no  Protocols used: Bee or Yellow Jacket Sting-A-AH

## 2024-01-22 NOTE — Progress Notes (Unsigned)
 New Patient Note  RE: Francis Edward [name] MRN: [id number] DOB: 01-Mar-1986 Date of Office Visit: 01/23/2024  Consult requested by: Lucius Krabbe, NP Primary care provider: Katrinka Garnette KIDD, MD  Chief Complaint: No chief complaint on file.  History of Present Illness: I had the pleasure of seeing Francis Doswell for initial evaluation at the [facility] on 01/23/2024. He is a 38 y.o. male, who is referred here by Katrinka Garnette KIDD, MD for the evaluation of hymenoptera allergy.  Discussed the use of AI scribe software for clinical note transcription with the patient, who gave verbal consent to proceed.  History of Present Illness             01/13/2024 PCP visit: Francis Edward Inglett is a 38 year old male who presents with swelling and tightness in the ankle and foot following a suspected yellow jacket sting.   He experienced an immediate sting on his right ankle on Friday evening, leading to a small red dot at the site without blistering. The area quickly became swollen with redness and warmth, causing tightness and itching, which he manages by rubbing with his hand. He recalls a similar reaction as a teenager with significant swelling of the hand. The swelling persists despite icing, and walking or standing worsens it, causing difficulty in weight-bearing and a limp due to pain. He notes slight numbness, especially when driving, and tingling when clothing contacts the area. There are no fever, chills, or systemic symptoms. He has no history of anaphylactic reactions and has never used an EpiPen , but since this reaction is worse than last time he is concerned how allergic he may be.  Assessment and Plan: Francis is a 38 y.o. male with: ***  Assessment and Plan               No follow-ups on file.  No orders of the defined types were placed in this encounter.  Lab Orders  No laboratory test(s) ordered today    Other allergy  screening: Asthma: {Blank single:19197::yes,no} Rhino conjunctivitis: {Blank single:19197::yes,no} Food allergy: {Blank single:19197::yes,no} Medication allergy: {Blank single:19197::yes,no} Hymenoptera allergy: {Blank single:19197::yes,no} Urticaria: {Blank single:19197::yes,no} Eczema:{Blank single:19197::yes,no} History of recurrent infections suggestive of immunodeficency: {Blank single:19197::yes,no}  Diagnostics: Spirometry:  Tracings reviewed. His effort: {Blank single:19197::Good reproducible efforts.,It was hard to get consistent efforts and there is a question as to whether this reflects a maximal maneuver.,Poor effort, data can not be interpreted.} FVC: ***L FEV1: ***L, ***% predicted FEV1/FVC ratio: ***% Interpretation: {Blank single:19197::Spirometry consistent with mild obstructive disease,Spirometry consistent with moderate obstructive disease,Spirometry consistent with severe obstructive disease,Spirometry consistent with possible restrictive disease,Spirometry consistent with mixed obstructive and restrictive disease,Spirometry uninterpretable due to technique,Spirometry consistent with normal pattern,No overt abnormalities noted given today's efforts}.  Please see scanned spirometry results for details.  Skin Testing: {Blank single:19197::Select foods,Environmental allergy panel,Environmental allergy panel and select foods,Food allergy panel,None,Deferred due to recent antihistamines use}. *** Results discussed with patient/family.   Past Medical History: Patient Active Problem List   Diagnosis Date Noted  . Bloating 12/25/2023  . Ineffective esophageal motility 03/10/2021  . Elevated blood pressure reading 08/13/2016  . Right testicular pain 08/13/2016  . Injury of right index finger 08/13/2016  . GASTROESOPHAGEAL REFLUX DISEASE 07/27/2009  . ABDOMINAL BLOATING 07/27/2009  . Abnormal abdominal x-ray  07/27/2009   Past Medical History:  Diagnosis Date  . Esophagitis, erosive 07/28/2009  . Fundic gland polyps of stomach, benign   . GERD (gastroesophageal reflux disease)    Past  Surgical History: Past Surgical History:  Procedure Laterality Date  . ESOPHAGEAL MANOMETRY N/A 06/19/2017   Procedure: ESOPHAGEAL MANOMETRY (EM);  Surgeon: Avram Lupita BRAVO, MD;  Location: [facility];  Service: Endoscopy;  Laterality: N/A;  . ESOPHAGOGASTRODUODENOSCOPY    . TONSILLECTOMY     young age   Medication List:  Current Outpatient Medications  Medication Sig Dispense Refill  . pantoprazole  (PROTONIX ) 40 MG tablet Take 1 tablet (40 mg total) by mouth 2 (two) times daily. 180 tablet 3   No current facility-administered medications for this visit.   Allergies: Allergies  Allergen Reactions  . Amoxicillin   . Penicillins    Social History: Social History   Socioeconomic History  . Marital status: Married    Spouse name: Not on file  . Number of children: 1  . Years of education: Not on file  . Highest education level: Not on file  Occupational History  . Not on file  Tobacco Use  . Smoking status: Former    Current packs/day: 0.00    Average packs/day: 0.1 packs/day for 4.0 years (0.4 ttl pk-yrs)    Types: Cigarettes    Start date: 07/16/2006    Quit date: 07/16/2010    Years since quitting: 13.5  . Smokeless tobacco: Never  Vaping Use  . Vaping status: Never Used  Substance and Sexual Activity  . Alcohol use: Yes    Alcohol/week: 6.0 - 8.0 standard drinks of alcohol    Types: 6 - 8 Standard drinks or equivalent per week    Comment: occ.  . Drug use: No  . Sexual activity: Not on file  Other Topics Concern  . Not on file  Social History Narrative   Married. 1 daughter born 47 . 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    2 year degree at [employer]: traveling/photography, outdoor activity.        1 caffeine/day   Former smoker   1 EtOH/day   Social Drivers of Community education officer: Not on [employer] Insecurity: Not on file  Transportation Needs: Not on file  Physical Activity: Not on file  Stress: Not on file  Social Connections: Not on file   Lives in a ***. Smoking: *** Occupation: ***  Environmental HistorySurveyor, minerals in the house: Network engineer in the family room: {Blank single:19197::yes,no} Carpet in the bedroom: {Blank single:19197::yes,no} Heating: {Blank single:19197::electric,gas,heat pump} Cooling: {Blank single:19197::central,window,heat pump} Pet: {Blank single:19197::yes ***,no}  Family History: Family History  Problem Relation Age of Onset  . Healthy Mother   . Hypertension Father   . Rheum arthritis Father   . Congestive Heart Failure Father        unclear trigger  . CVA Maternal Grandmother   . Lung cancer Maternal Grandfather   . Heart disease Paternal Grandfather   . Pancreatic cancer Paternal Aunt   . Colon cancer Neg Hx   . Esophageal cancer Neg Hx   . Rectal cancer Neg Hx   . Stomach cancer Neg Hx    Problem                               Relation Asthma                                   ***  Eczema                                *** Food allergy                          *** Allergic rhino conjunctivitis     ***  Review of Systems  Constitutional:  Negative for appetite change, chills, fever and unexpected weight change.  HENT:  Negative for congestion and rhinorrhea.   Eyes:  Negative for itching.  Respiratory:  Negative for cough, chest tightness, shortness of breath and wheezing.   Cardiovascular:  Negative for chest pain.  Gastrointestinal:  Negative for abdominal pain.  Genitourinary:  Negative for difficulty urinating.  Skin:  Negative for rash.  Neurological:  Negative for headaches.    Objective: There were no vitals taken for this  visit. There is no height or weight on file to calculate BMI. Physical Exam Vitals and nursing note reviewed.  Constitutional:      Appearance: Normal appearance. He is well-developed.  HENT:     Head: Normocephalic and atraumatic.     Right Ear: Tympanic membrane and external ear normal.     Left Ear: Tympanic membrane and external ear normal.     Nose: Nose normal.     Mouth/Throat:     Mouth: Mucous membranes are moist.     Pharynx: Oropharynx is clear.  Eyes:     Conjunctiva/sclera: Conjunctivae normal.  Cardiovascular:     Rate and Rhythm: Normal rate and regular rhythm.     Heart sounds: Normal heart sounds. No murmur heard.    No friction rub. No gallop.  Pulmonary:     Effort: Pulmonary effort is normal.     Breath sounds: Normal breath sounds. No wheezing, rhonchi or rales.  Musculoskeletal:     Cervical back: Neck supple.  Skin:    General: Skin is warm.     Findings: No rash.  Neurological:     Mental Status: He is alert and oriented to person, place, and time.  Psychiatric:        Behavior: Behavior normal.   The plan was reviewed with the patient/family, and all questions/concerned were addressed.  It was my pleasure to see Francis today and participate in his care. Please feel free to contact me with any questions or concerns.  Sincerely,  Orlan Cramp, DO Allergy & Immunology  [facility]  [facility] office: [phone] [facility] office: [phone]

## 2024-01-23 ENCOUNTER — Other Ambulatory Visit: Payer: Self-pay

## 2024-01-23 ENCOUNTER — Ambulatory Visit: Admitting: Allergy

## 2024-01-23 ENCOUNTER — Encounter: Payer: Self-pay | Admitting: Allergy

## 2024-01-23 VITALS — BP 126/82 | HR 89 | Temp 98.7°F | Resp 18 | Ht 72.44 in | Wt 233.8 lb

## 2024-01-23 DIAGNOSIS — Z880 Allergy status to penicillin: Secondary | ICD-10-CM | POA: Diagnosis not present

## 2024-01-23 DIAGNOSIS — T63481D Toxic effect of venom of other arthropod, accidental (unintentional), subsequent encounter: Secondary | ICD-10-CM

## 2024-01-23 DIAGNOSIS — B999 Unspecified infectious disease: Secondary | ICD-10-CM

## 2024-01-23 DIAGNOSIS — T63481A Toxic effect of venom of other arthropod, accidental (unintentional), initial encounter: Secondary | ICD-10-CM

## 2024-01-23 DIAGNOSIS — Z91038 Other insect allergy status: Secondary | ICD-10-CM | POA: Diagnosis not present

## 2024-01-23 DIAGNOSIS — J3089 Other allergic rhinitis: Secondary | ICD-10-CM | POA: Diagnosis not present

## 2024-01-23 MED ORDER — MOMETASONE FUROATE 50 MCG/ACT NA SUSP: 1.0000 | Freq: Every day | NASAL | 5 refills | Status: AC | PRN

## 2024-01-23 MED ORDER — EPINEPHRINE 0.3 MG/0.3ML IJ SOAJ: 0.3000 mg | INTRAMUSCULAR | 1 refills | Status: AC | PRN

## 2024-01-23 NOTE — Patient Instructions (Addendum)
 Bee stings Continue to avoid. I have prescribed epinephrine  injectable device and demonstrated proper use. For mild symptoms you can take over the counter antihistamines (zyrtec 10mg  to 20mg ) and monitor symptoms closely.  If symptoms worsen or if you have severe symptoms including breathing issues, throat closure, significant swelling, whole body hives, severe diarrhea and vomiting, lightheadedness then use epinephrine  and seek immediate medical care afterwards. Emergency action plan given.  Get bloodwork If significant positives then will recommend allergy injections. If negative will recommend allergy testing in our [facility] office - this is done once per month usually.  We are ordering labs, so please allow 1-2 weeks for the results to come back. With the newly implemented Cures Act, the labs might be visible to you at the same time that they become visible to me. However, I will not address the results until all of the results are back, so please be patient.  In the meantime, continue recommendations in your patient instructions, including avoidance measures (if applicable), until you hear from me.  Penicillin allergy: Ask your parents about specifics about your reactions.  Consider penicillin allergy skin testing and in office drug challenge in the future.  Over 90% of people with history of penicillin allergy which occurred over 10 years ago are found to be non-allergic.  You must be off antihistamines for 3-5 days before. Must be in good health and not ill. No vaccines/injections/antibiotics within the past 7 days. Plan on being in the office for 2-3 hours and must bring in the drug you want to do the oral challenge for - will send in prescription to pick up a few days before. You must call to schedule an appointment and specify it's for a drug challenge.   Environmental allergies Consider allergy testing in the future. Use over the counter antihistamines such as Zyrtec (cetirizine),  Claritin (loratadine), Allegra (fexofenadine), or Xyzal (levocetirizine) daily as needed. May take twice a day during allergy flares. May switch antihistamines every few months. Use Nasonex  (mometasone ) nasal spray 1-2 sprays per nostril once a day as needed for nasal congestion.   Infections Keep track of infections and antibiotics use. If persistent will get bloodwork next to look at immune system.   Follow up for penicillin skin testing and possible drug challenge.

## 2024-01-30 ENCOUNTER — Encounter: Admitting: Allergy

## 2024-08-31 ENCOUNTER — Encounter: Payer: BC Managed Care – PPO | Admitting: Family Medicine
